# Patient Record
Sex: Male | Born: 1975 | Race: Black or African American | Hispanic: No | Marital: Married | Smoking: Never smoker
Health system: Southern US, Community
[De-identification: ages and names within clinical notes are randomized; demographics above are authoritative.]

---

## 2015-04-27 ENCOUNTER — Emergency Department (HOSPITAL_COMMUNITY)
Admission: EM | Admit: 2015-04-27 | Discharge: 2015-04-28 | Disposition: A | Discharge status: Home / Self Care | Payer: BLUE CROSS/BLUE SHIELD | Attending: Emergency Medicine | Admitting: Emergency Medicine

## 2015-04-27 ENCOUNTER — Encounter (HOSPITAL_COMMUNITY): Payer: Self-pay

## 2015-04-27 DIAGNOSIS — R109 Unspecified abdominal pain: Secondary | ICD-10-CM | POA: Insufficient documentation

## 2015-04-27 DIAGNOSIS — R197 Diarrhea, unspecified: Secondary | ICD-10-CM | POA: Diagnosis not present

## 2015-04-27 DIAGNOSIS — R1912 Hyperactive bowel sounds: Secondary | ICD-10-CM | POA: Diagnosis not present

## 2015-04-27 DIAGNOSIS — R1084 Generalized abdominal pain: Secondary | ICD-10-CM | POA: Diagnosis present

## 2015-04-27 DIAGNOSIS — R52 Pain, unspecified: Secondary | ICD-10-CM

## 2015-04-27 NOTE — ED Notes (Signed)
Pt presents with c/o abdominal pain and diarrhea. Pt reports he started taking Vitamin D recently and it "made him feel some kind of way". Pt is connecting these symptoms to the Vitamin D intake.

## 2015-04-28 ENCOUNTER — Emergency Department (HOSPITAL_COMMUNITY): Payer: BLUE CROSS/BLUE SHIELD

## 2015-04-28 LAB — COMPREHENSIVE METABOLIC PANEL
ALBUMIN: 4.3 g/dL (ref 3.5–5.0)
ALT: 32 U/L (ref 17–63)
ANION GAP: 9 (ref 5–15)
AST: 31 U/L (ref 15–41)
Alkaline Phosphatase: 90 U/L (ref 38–126)
BUN: 11 mg/dL (ref 6–20)
CALCIUM: 9.2 mg/dL (ref 8.9–10.3)
CO2: 26 mmol/L (ref 22–32)
CREATININE: 1.41 mg/dL — AB (ref 0.61–1.24)
Chloride: 103 mmol/L (ref 101–111)
GFR calc non Af Amer: >60 mL/min (ref >60)
GLUCOSE: 98 mg/dL (ref 65–99)
Potassium: 3.5 mmol/L (ref 3.5–5.1)
Sodium: 138 mmol/L (ref 135–145)
Total Bilirubin: 0.9 mg/dL (ref 0.3–1.2)
Total Protein: 9.1 g/dL — ABNORMAL HIGH (ref 6.5–8.1)

## 2015-04-28 LAB — LIPASE, BLOOD: LIPASE: 23 U/L (ref 22–51)

## 2015-04-28 LAB — CBC
HCT: 45.1 % (ref 39.0–52.0)
Hemoglobin: 15.2 g/dL (ref 13.0–17.0)
MCH: 29.8 pg (ref 26.0–34.0)
MCHC: 33.7 g/dL (ref 30.0–36.0)
MCV: 88.4 fL (ref 78.0–100.0)
Platelets: 240 10*3/uL (ref 150–400)
RBC: 5.1 MIL/uL (ref 4.22–5.81)
RDW: 14.1 % (ref 11.5–15.5)
WBC: 13.7 10*3/uL — ABNORMAL HIGH (ref 4.0–10.5)

## 2015-04-28 MED ORDER — GI COCKTAIL ~~LOC~~
30.0000 mL | Freq: Once | ORAL | Status: AC
  Administered 2015-04-28: 30 mL via ORAL
  Filled 2015-04-28: qty 30

## 2015-04-28 MED ORDER — DICYCLOMINE HCL 10 MG/ML IM SOLN
20.0000 mg | Freq: Once | INTRAMUSCULAR | Status: AC
  Administered 2015-04-28: 20 mg via INTRAMUSCULAR
  Filled 2015-04-28: qty 2

## 2015-04-28 MED ORDER — KETOROLAC TROMETHAMINE 60 MG/2ML IM SOLN
60.0000 mg | Freq: Once | INTRAMUSCULAR | Status: AC
  Administered 2015-04-28: 60 mg via INTRAMUSCULAR
  Filled 2015-04-28: qty 2

## 2015-04-28 MED ORDER — DICYCLOMINE HCL 20 MG PO TABS: 20.0000 mg | ORAL_TABLET | Freq: Two times a day (BID) | ORAL | Status: AC

## 2015-04-28 NOTE — ED Notes (Signed)
Pt is unable to give urine sample at this time but is aware that the sample is needed

## 2015-04-28 NOTE — ED Provider Notes (Signed)
CSN: [id number]     Arrival date & time 04/27/15  2254 History  This chart was scribed for Kamden Stanislaw, MD by Placido Sou, ED scribe. This patient was seen in room WA15/WA15 and the patient's care was started at 12:52 AM.    Chief Complaint  Patient presents with  . Abdominal Pain  . Diarrhea   Patient is a 39 y.o. male presenting with abdominal pain. The history is provided by the patient. No language interpreter was used.  Abdominal Pain Pain location:  Generalized Pain quality: cramping   Pain radiates to:  Does not radiate Pain severity:  Moderate Onset quality:  Sudden Duration:  7 days Timing:  Constant Progression:  Unchanged Chronicity:  New Context: not diet changes, not previous surgeries and not retching   Relieved by:  Nothing Worsened by:  Nothing tried Ineffective treatments:  None tried Associated symptoms: no chills, no fever, no nausea and no vomiting   Risk factors: no alcohol abuse     HPI Comments: Cory Harris is a 39 y.o. male who presents to the Emergency Department complaining of constant, moderate, abd pain with onset 1 week ago. Pt notes beginning an oral vitamin D supplement beginning 1 week ago due to his most recent level being 7 and further notes his last dosage taken was yesterday. He notes associated diarrhea and fever and took Alka Seltzer 3-4 hours ago with some moderate relief of his symptoms.  He denies any recent changes in his diet or being on any other medications.   History reviewed. No pertinent past medical history. History reviewed. No pertinent past surgical history. No family history on file. History  Substance Use Topics  . Smoking status: Never Smoker   . Smokeless tobacco: Not on file  . Alcohol Use: Yes     Comment: occasionally     Review of Systems  Constitutional: Negative for fever and chills.  Gastrointestinal: Positive for abdominal pain. Negative for nausea and vomiting.  All other systems reviewed and are  negative.  Allergies  Review of patient's allergies indicates no known allergies.  Home Medications   Prior to Admission medications   Not on File   BP 123/71 mmHg  Pulse 88  Temp(Src) 99.2 F (37.3 C) (Oral)  Resp 21  SpO2 98% Physical Exam  Constitutional: He is oriented to person, place, and time. He appears well-developed and well-nourished. No distress.  HENT:  Head: Normocephalic and atraumatic.  Mouth/Throat: Oropharynx is clear and moist.  Eyes: EOM are normal. Pupils are equal, round, and reactive to light.  Neck: Neck supple.  Cardiovascular: Normal rate, regular rhythm and normal heart sounds.  Exam reveals no gallop and no friction rub.   No murmur heard. Pulmonary/Chest: Effort normal and breath sounds normal. No respiratory distress. He has no wheezes. He has no rales.  Abdominal: Soft. He exhibits no mass. There is no tenderness. There is no rebound and no guarding.  Hyperactive bowel sounds;   Neurological: He is alert and oriented to person, place, and time. He has normal reflexes.  Skin: Skin is warm and dry. He is not diaphoretic.  Psychiatric: He has a normal mood and affect.  Nursing note and vitals reviewed.   ED Course  Procedures  DIAGNOSTIC STUDIES: Oxygen Saturation is 98% on RA, normal by my interpretation.    COORDINATION OF CARE: 12:58 AM Discussed treatment plan with pt at bedside and pt agreed to plan.  Labs Review Labs Reviewed  COMPREHENSIVE METABOLIC PANEL - Abnormal;  Notable for the following:    Creatinine, Ser 1.41 (*)    Total Protein 9.1 (*)    All other components within normal limits  CBC - Abnormal; Notable for the following:    WBC 13.7 (*)    All other components within normal limits  LIPASE, BLOOD  URINALYSIS, ROUTINE W REFLEX MICROSCOPIC (NOT [facility])    Imaging Review No results found.   EKG Interpretation None      MDM   Final diagnoses:  None    Medications  dicyclomine (BENTYL) injection 20 mg (20  mg Intramuscular Given 04/28/15 0138)  ketorolac (TORADOL) injection 60 mg (60 mg Intramuscular Given 04/28/15 0138)  gi cocktail (Maalox,Lidocaine,Donnatal) (30 mLs Oral Given 04/28/15 0138)   Patient well appearing, normal CT.  As the symptoms started with starting vitamin D this is likely related.  Recommend stopping same and discussing with PMD.    I personally performed the services described in this documentation, which was scribed in my presence. The recorded information has been reviewed and is accurate.     Cy BlamerApril Satoshi Kalas, MD 04/29/15 [phone]

## 2015-04-29 ENCOUNTER — Encounter (HOSPITAL_COMMUNITY): Payer: Self-pay | Admitting: Emergency Medicine

## 2017-11-29 ENCOUNTER — Emergency Department (HOSPITAL_COMMUNITY)
Admission: EM | Admit: 2017-11-29 | Discharge: 2017-11-29 | Disposition: A | Discharge status: Home / Self Care | Payer: Self-pay | Attending: Emergency Medicine | Admitting: Emergency Medicine

## 2017-11-29 ENCOUNTER — Other Ambulatory Visit: Payer: Self-pay

## 2017-11-29 ENCOUNTER — Emergency Department (HOSPITAL_COMMUNITY): Payer: Self-pay

## 2017-11-29 ENCOUNTER — Encounter (HOSPITAL_COMMUNITY): Payer: Self-pay | Admitting: *Deleted

## 2017-11-29 DIAGNOSIS — M542 Cervicalgia: Secondary | ICD-10-CM

## 2017-11-29 DIAGNOSIS — M5410 Radiculopathy, site unspecified: Secondary | ICD-10-CM | POA: Insufficient documentation

## 2017-11-29 DIAGNOSIS — Z79899 Other long term (current) drug therapy: Secondary | ICD-10-CM | POA: Insufficient documentation

## 2017-11-29 LAB — CBC
HCT: 45.4 % (ref 39.0–52.0)
HEMOGLOBIN: 15.3 g/dL (ref 13.0–17.0)
MCH: 30.1 pg (ref 26.0–34.0)
MCHC: 33.7 g/dL (ref 30.0–36.0)
MCV: 89.2 fL (ref 78.0–100.0)
Platelets: 241 10*3/uL (ref 150–400)
RBC: 5.09 MIL/uL (ref 4.22–5.81)
RDW: 13.9 % (ref 11.5–15.5)
WBC: 5.1 10*3/uL (ref 4.0–10.5)

## 2017-11-29 LAB — BASIC METABOLIC PANEL
Anion gap: 10 (ref 5–15)
BUN: 11 mg/dL (ref 6–20)
CHLORIDE: 104 mmol/L (ref 101–111)
CO2: 24 mmol/L (ref 22–32)
Calcium: 9 mg/dL (ref 8.9–10.3)
Creatinine, Ser: 1.23 mg/dL (ref 0.61–1.24)
GFR calc Af Amer: >60 mL/min (ref >60)
GLUCOSE: 107 mg/dL — AB (ref 65–99)
POTASSIUM: 4.6 mmol/L (ref 3.5–5.1)
Sodium: 138 mmol/L (ref 135–145)

## 2017-11-29 LAB — I-STAT TROPONIN, ED: Troponin i, poc: 0 ng/mL (ref 0.00–0.08)

## 2017-11-29 MED ORDER — METHOCARBAMOL 500 MG PO TABS: 500.0000 mg | ORAL_TABLET | Freq: Two times a day (BID) | ORAL | 0 refills | Status: AC

## 2017-11-29 MED ORDER — PREDNISONE 10 MG PO TABS: ORAL_TABLET | ORAL | 0 refills | Status: AC

## 2017-11-29 NOTE — ED Provider Notes (Signed)
[facility] Provider Note   CSN: [id number] Arrival date & time: 11/29/17  [id number]     History   Chief Complaint No chief complaint on file.   HPI Cory Harris is a 42 y.o. male.  The history is provided by the patient. No language interpreter was used.  Neck Injury  This is a new problem. The current episode started yesterday. The problem occurs constantly. The problem has been gradually improving. Nothing aggravates the symptoms. Nothing relieves the symptoms. He has tried nothing for the symptoms. The treatment provided mild relief.  Pt reports he had a pain in the back of his neck last pm.  Pt reports he has had some pain in his arm and numbness in hand and arm.  Pt reports symptoms come and go.  [place] here from [place].    History reviewed. No pertinent past medical history.  There are no active problems to display for this patient.   History reviewed. No pertinent surgical history.     Home Medications    Prior to Admission medications   Medication Sig Start Date End Date Taking? Authorizing Provider  acetaminophen (TYLENOL) 500 MG tablet Take 500 mg by mouth every 6 (six) hours as needed for mild pain.   Yes [provider]  dicyclomine (BENTYL) 20 MG tablet Take 1 tablet (20 mg total) by mouth 2 (two) times daily. Patient taking differently: Take 20 mg by mouth as needed.  04/28/15  Yes Palumbo, April, MD  loratadine-pseudoephedrine (CLARITIN-D 24-HOUR) 10-240 MG 24 hr tablet Take 1 tablet by mouth daily as needed for allergies.   Yes [provider]  Multiple Vitamin (MULTIVITAMIN WITH MINERALS) TABS tablet Take 1 tablet by mouth daily.   Yes [provider]  naproxen sodium (ALEVE) 220 MG tablet Take 220 mg by mouth daily as needed (for pain).   Yes [provider]  ibuprofen (ADVIL,MOTRIN) 200 MG tablet Take 400 mg by mouth every 6 (six) hours as needed for moderate pain.    [provider]  saccharomyces boulardii (FLORASTOR) 250 MG capsule Take 250 mg by mouth 2 (two) times daily.    [provider]  Vitamin D, Ergocalciferol, (DRISDOL) 50000 UNITS CAPS capsule Take 1 capsule by mouth once a week. Monday 04/19/15   [provider]    Family History No family history on file.  Social History Social History   Tobacco Use  . Smoking status: Never Smoker  . Smokeless tobacco: Never Used  Substance Use Topics  . Alcohol use: Yes    Comment: occasionally   . Drug use: No     Allergies   Patient has no known allergies.   Review of Systems Review of Systems  Musculoskeletal: Positive for neck pain. Negative for back pain and joint swelling.  All other systems reviewed and are negative.    Physical Exam Updated Vital Signs BP (!) 139/93   Pulse (!) 58   Temp 98.2 F (36.8 C) (Oral)   Resp 15   Ht 5\' 11"  (1.803 m)   Wt 113.4 kg (250 lb)   SpO2 98%   BMI 34.87 kg/m   Physical Exam  Constitutional: He appears well-developed and well-nourished.  HENT:  Head: Normocephalic.  Right Ear: External ear normal.  Left Ear: External ear normal.  Mouth/Throat: Oropharynx is clear and moist.  Eyes: Pupils are equal, round, and reactive to light.  Neck: Normal range of motion.  Tender right trapezius, from  bilat arms,  Good strength.  Moves all fingers.   Cardiovascular: Normal rate.  Pulmonary/Chest: Effort normal.  Musculoskeletal: Normal range of motion.  Neurological: He is alert.  Skin: Skin is warm.  Psychiatric: He has a normal mood and affect.  Nursing note and vitals reviewed.    ED Treatments / Results  Labs (all labs ordered are listed, but only abnormal results are displayed) Labs Reviewed  BASIC METABOLIC PANEL - Abnormal; Notable for the following components:      Result Value   Glucose, Bld 107 (*)    All other components within normal limits  CBC  I-STAT TROPONIN, ED    EKG  EKG  Interpretation None       Radiology Dg Chest 2 View  Result Date: 11/29/2017 CLINICAL DATA:  Right shoulder and arm pain. EXAM: CHEST  2 VIEW COMPARISON:  None. FINDINGS: Heart and mediastinal contours are within normal limits. No focal opacities or effusions. No acute bony abnormality. IMPRESSION: No active cardiopulmonary disease. Electronically Signed   By: Charlett Nose M.D.   On: 11/29/2017 08:57   Dg Cervical Spine Complete  Result Date: 11/29/2017 CLINICAL DATA:  Right-sided neck pain radiating into the right arm and hand. EXAM: CERVICAL SPINE - COMPLETE 4+ VIEW COMPARISON:  None. FINDINGS: The lateral view is diagnostic to the C7-T1 level. There is no acute fracture or subluxation. Vertebral body heights are preserved. Alignment is normal. Interveterbral disc spaces are maintained. The neural foramina are widely patent.Normal prevertebral soft tissues. IMPRESSION: Negative cervical spine radiographs. Electronically Signed   By: Obie Dredge M.D.   On: 11/29/2017 12:25    Procedures Procedures (including critical care time)  Medications Ordered in ED Medications - No data to display   Initial Impression / Assessment and Plan / ED Course  I have reviewed the triage vital signs and the nursing notes.  Pertinent labs & imaging results that were available during my care of the patient were reviewed by me and considered in my medical decision making (see chart for details).     MDM  Cspine xray is normal, Chest xray is normal.  I suspect pain is radicular.  I will treat with prednisone and robaxin.  Pt is advised to follow up with Orthopaedist in [place]  Final Clinical Impressions(s) / ED Diagnoses   Final diagnoses:  Neck pain  Radiculopathy, unspecified spinal region    ED Discharge Orders        Ordered    predniSONE (DELTASONE) 10 MG tablet     11/29/17 1254    methocarbamol (ROBAXIN) 500 MG tablet  2 times daily     11/29/17 1254    An After Visit Summary was  printed and given to the patient.    Elson Areas, [place] 11/29/17 1300    Margarita Grizzle, MD 11/30/17 [phone]

## 2017-11-29 NOTE — ED Triage Notes (Addendum)
Last pm at 9:45pm started having right posterior neck pain and then noticed right pain to right arm with tingling and numbness and patient states the symptoms come and go.  Pt states the tingling then also went to left hand.  NO loss in function.  Pt did report a sharp pain to right leg.  Reports some chest pain before

## 2017-11-29 NOTE — Discharge Instructions (Signed)
Schedule to see an Orthopaedist for recheck.  Ice area of pain

## 2017-12-27 ENCOUNTER — Ambulatory Visit: Admitting: Surgery

## 2017-12-27 ENCOUNTER — Ambulatory Visit: Admit: 2017-12-27 | Payer: No Typology Code available for payment source

## 2017-12-27 NOTE — Progress Notes (Signed)
.  Progress Notes  .  Patient: Gilbert Stewart  Provider: Augustine Radar    .  DOB: [birth year] Age: 42 Y Sex: Male  .  PCP: Jonathon Bellows    Date: 12/27/2017  .  --------------------------------------------------------------------------------  .  REASON FOR APPOINTMENT  .  1. NP for hematospermia  .  2. Hematospermia  .  HISTORY OF PRESENT ILLNESS  .  Adult Urology:   This is a 42 year old male who is here upon self referral for my  opinion regarding hematospermia..  .  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 It happened once. .Color of semen:  rusty color.Gross hematospermia.Prior history of GU surgery or  trauma: History of Chronic prostatitis for 10-15 years. Sees a  Insurance underwriter in [place] [facility]: Dr Normand Sloop, Montez Hageman. M.D) treated with Ciprofloxacin/Doxacycline with  good effect.Pain during intercourse or ejaculation:  None.Fever/chills, dysuria, urgency/frequency or weak urine  stream:.History of fertility: Never had kids .Family history of  prostate cancer or other GU cancers: None.Patient denies gross  hematuria, fever/chills, abdominal/flank pain, urgency/frequency,  weak urine stream, dysuria, appetite changes, new bony pain,  unintentional weight loss or fatigue...  .  ED:   Pt also mentioned that his erection is not as strong as before.  It can not maintain. He also feels decreased libido.  [name]  CURRENT MEDICATIONS  .  None  .  PAST MEDICAL HISTORY  .  Hematospermia  .  ALLERGIES  .  N.K.D.A.  .  SURGICAL HISTORY  .  No Surgical History documented.  [name]  FAMILY HISTORY  .  Mother: alive, diagnosed with Hypertension  Father: deceased, car accident  2 brother(s) , 2 sister(s) .  sister has systemic lupus.  .  SOCIAL HISTORY  .  .  Tobacco  history:Never smoked  .  [name]  Alcohol  Occasionally  .  HOSPITALIZATION/MAJOR DIAGNOSTIC PROCEDURE  .  No Hospitalization History.  [name]  REVIEW OF SYSTEMS  .  Urology ROS:  .  Constitutional:     No fevers, chills, weight loss or general  weakness . Head:    No headache or dizziness . Eyes:    No loss  of vision or double vision . Ears:    No hearing loss, tinnitus,  otalgia, otorrhea . Nose:    No nasal discharge . Throat:    No  hoarseness or difficulty swallowing . Integumentary:    No rash  or skin lesions. No changes in hair or nail character . Lungs:     No shortness of breath. No new, frequent cough . Cardiovascular:     No chest pain or palpitations . Gastrointestinal:    No  abdominal pain, nausea, vomiting or change in bowel movements .  Genitourinary:    No dysuria or urethral discharge .  Musculoskeletal:    No joint/muscle pain or swelling. Normal gait  and mobility . Neurological:    No seizures, light headedness,  memory loss or numbness . Psychiatry:    No mood or behavioral  changes. No anxiety or depression . Endocrine:    No hirsutism,  excessive hair loss/alopecia. No polyuria or polydipsia .  Hematologic/Lymphatic:    No easy bruising, bleeding or enlarged  lymph nodes . Allergic/Immunologic:    No environmental or food  allergies .  [name]  VITAL SIGNS  .  Pain scale 3, Ht-in 71, Wt-lbs 244, BMI 34.03, BSA  2.35, Ht-cm  180.34, Wt-kg 110.68.  [name]  PHYSICAL EXAMINATION  .  Urology PE:  General Appearance:  Well-developed, well nourished, alert and  cooperative, NAD, normal secondary sexual characteristics.  HEENT:  Normo-cephalic, atraumatic, PERRLA, EOMI, anicteric,  vision grossly intact, no nasal discharge. Sinuses, non-tender.  Oropharhynx no exudate or lesions.  Skin:  Normal color, texture, turgor, with no lesions, eruptions,  rashes, bruises or petechiae.  Neck:  Supple, no masses. Normal ROM. Trachea is in midline. No  thyromegaly.  Chest  Normal AP diameter. No rib tenderness.  Lungs:  Normal respirations, symmetric excursion with no  accessory muscle use.  Back:  no CAVT Vertebral column aligned no scoliosis or kyphosis.  No masses or tenderness.  Cardiovascular:  RRR. No murmur,  pulses are intact.  Abdomen:  Soft, benign, non-tender, non-distended, no palpable  masses, no hepato-splenomegaly, no hernias. Bladder is not  palpable.  Extremities:  No clubbing, cyanosis or pitting edema.  Musculo-Skeletal:  No muscle wasting, joint swelling or  tenderness.  Lymphatic:  No cervical, axillary or inguinal adenopathy.  Neurologic:  Oriented 3x with no focal deficits.  GU Male:  Genitalia:  Normally developed male genitalia.  Phallus:  No lesions or chordee.  Glans:  No lesions or inflammation.  Meatus:   No discharge.  Scrotum:  No mass or tenderness. No hernias, hydroceles or  varicoceles, no Lymphadenopathy.  Spermatic Cords:  No masses, induration or tenderness.  Epididymes:  No masses, tenderness or induration.  Testes:  Normal size and consistency. No masses, asymmetry,  tenderness or atrophy.  Back:  No sacral Rebecca or dimples. No CVAT or SPT. Bladder is not  palpable.  .  ASSESSMENTS  .  Hematospermia - R36.1 (Primary)  .  Erectile dysfunction, unspecified erectile dysfunction type -  N52.9  .  TREATMENT  .  Hematospermia  LAB: Urine Dip POC  Negative for LE, NIT, and Blood  Notes: Hematospermia is almost always a benign condition. There  are multiple causes for hematospermia, such as urinary tract  infection, seminal duct abnormalities, enlarged prostate,  prostatic calculi, GU tumors (very rare), coagulopathy. In most  cases, the evaluation will not identify a clear cause of  hematospermia. Thus, there is no specific medical or surgical  treatment for the majority of patients, and the condition will  usually resolve spontaneously. . There are no obvious reasons at  this point for his hematospermia after evaluation. I reassured  him and recommended observational management for now. If his  hematospermia is persistent or he notices any symptoms, he will  call my office. The further evaluation would be transrectal U/S,  pelvic MRI, cystoscopy or even seminal vesiculoscopy. [name] He  understood  and will call my office for follow up as needed. Will  check Testosterone.  .  .  Erectile dysfunction, unspecified erectile  dysfunction type  Notes: We discussed the etiology of erectile function, including  psychogenic, vasculogenic, endocrinogenic, neurogenic. The most  common reason for ED is age then follow by diabetes. .Pt would  like to have PSA and T checked to rule out hormonal etiology of  his ED first. Will check the lab and RTC in one month.  [name]  PREVENTIVE MEDICINE  .  Counseling:  .  Smoking   .  BMI Management   .  [name]  PROCEDURE CODES  .  7532 URO MD URINALYSIS AUTO W/O MICROSCOPY  .  FOLLOW UP  .  Testosterone check and PSA total in AM; F/U  in one month  .  Electronically signed by Augustine Radar , MD on  12/29/2017 at 11:42 PM EDT  .  Document electronically signed by Augustine Radar    .

## 2017-12-27 NOTE — Progress Notes (Signed)
* * *        Gilbert Stewart**    --- ---    84 Y old Male, DOB: [birth year], External MRN: [id number]    Account Number: [id number]    [id number]    Home: [phone]    Insurance: COMM[employer]CIAL INSURANCE    PCP: Gilbert Stewart Referring: Gilbert Stewart    Appointment Facility: [facility]        * * *    12/27/2017  Progress Notes: Gilbert Stewart **CHN#:** [id number]    --- ---    ---         **Current Medications**    ---       None    ---       **Past Medical History**    ---       Hematospermia.        ---       **Surgical History**    ---       No Surgical History documented.    ---       **Family History**    ---       Mother: alive, diagnosed with Hypertension    ---    Father: deceased, car accident    ---    2 brother(s) , 2 sister(s) .    ---    sister has systemic lupus.    ---       **Social History**    ---    Tobacco    history: _Never smoked_    Alcohol    _Occasionally_       **Allergies**    ---       N.K.D.A.    ---       **Hospitalization/Major Diagnostic Procedure**    ---       No Hospitalization History.    ---       **Review of Systems**    ---     _Urology ROS_ :    Constitutional: No fevers, chills, weight loss or general weakness. Head: No  headache or dizziness. Eyes: No loss of vision or double vision. Ears: No  hearing loss, tinnitus, otalgia, otorrhea. Nose: No nasal discharge. Throat:  No hoarseness or difficulty swallowing. Integumentary: No rash or skin  lesions. No changes in hair or nail character. Lungs: No shortness of breath.  No new, frequent cough. Cardiovascular: No chest pain or palpitations.  Gastrointestinal: No abdominal pain, nausea, vomiting or change in bowel  movements. Genitourinary: No dysuria or urethral discharge. Musculoskeletal:  No joint/muscle pain or swelling. Normal gait and mobility. Neurological: No  seizures, light headedness, memory loss or numbness. Psychiatry: No mood or  behavioral changes. No anxiety or depression.  Endocrine: No hirsutism,  excessive hair loss/alopecia. No polyuria or polydipsia.  Hematologic/Lymphatic: No easy bruising, bleeding or enlarged lymph nodes.  Allergic/Immunologic: No environmental or food allergies.            **Reason for Appointment**    ---       1\. NP for hematospermia    ---    2\. Hematospermia    ---       **Assessments**    ---    1\. Hematospermia - R36.1 (Primary)    ---    2\. Erectile dysfunction, unspecified erectile dysfunction type - N52.9    ---       **Treatment**    ---       **1\. Hematospermia**    _LAB: Urine Dip  POC_ Negative for LE, NIT, and Blood    Notes: Hematospermia is almost always a benign condition. There are multiple  causes for hematospermia, such as urinary tract infection, seminal duct  abnormalities, enlarged prostate, prostatic calculi, GU tumors (very rare),  coagulopathy. In most cases, the evaluation will not identify a clear cause of  hematospermia. Thus, there is no specific medical or surgical treatment for  the majority of patients, and the condition will usually resolve  spontaneously. . There are no obvious reasons at this point for his  hematospermia after evaluation. I reassured him and recommended observational  management for now. If his hematospermia is persistent or he notices any  symptoms, he will call my office. The further evaluation would be transrectal  U/S, pelvic MRI, cystoscopy or even seminal vesiculoscopy. [name] He understood and  will call my office for follow up as needed.    Will check Testosterone.    ---        **2\. Erectile dysfunction, unspecified erectile dysfunction type**    Notes: We discussed the etiology of erectile function, including psychogenic,  vasculogenic, endocrinogenic, neurogenic. The most common reason for ED is age  then follow by diabetes. .    Pt would like to have PSA and T checked to rule out hormonal etiology of his  ED first.    Will check the lab and RTC in one month.      **Preventive Medicine**    ---        Counseling:    Smoking .    BMI Management .    ---      **Follow Up**    ---    Testosterone check and PSA total in AM; F/U in one month       **History of Present Illness**    ---     _Adult Urology_ :    This is a 42 year old male who is here upon self referral for my opinion  regarding hematospermia.    .     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    [name]    He noticed blood in his semen since March 2019. It happened once.    .    Color of semen: rusty color    .    Gross hematospermia    .    Prior history of GU surgery or trauma: History of Chronic prostatitis for  10-15 years. Sees a Insurance underwriter in [place] Gilbert Stewart clinic: Dr Gilbert Stewart, Gilbert Stewart. M.D) treated with Ciprofloxacin/Doxacycline with good effect    .    Pain during intercourse or ejaculation: None    .    Fever/chills, dysuria, urgency/frequency or weak urine stream:    .    History of fertility: Never had kids    .    Family history of prostate cancer or other GU cancers: None    .    Patient denies gross hematuria, fever/chills, abdominal/flank pain,  urgency/frequency, weak urine stream, dysuria, appetite changes, new bony  pain, unintentional weight loss or fatigue.    .    .     _ED_ :    Pt also mentioned that his erection is not as strong as before. It can not  maintain. He also feels decreased libido.       **Vital Signs**    ---    Pain scale 3, Ht-in 71, Wt-lbs 244, BMI 34.03, BSA 2.35, Ht-cm 180.34, Wt-kg  110.68.       **Physical Examination**    ---     _Urology PE_ :    General Appearance: Well-developed, well nourished, alert and cooperative,  NAD, normal secondary sexual characteristics.    HEENT: Normo-cephalic, atraumatic, PERRLA, EOMI, anicteric, vision grossly  intact, no nasal discharge. Sinuses, non-tender. Oropharhynx no exudate or  lesions.    Skin: Normal color, texture, turgor, with no lesions, eruptions, rashes,  bruises or petechiae.    Neck: Supple, no masses. Normal ROM. Trachea is  in midline. No thyromegaly.    Chest Normal AP diameter. No rib tenderness.    Lungs: Normal respirations, symmetric excursion with no accessory muscle use.    Back: no CAVT Vertebral column aligned no scoliosis or kyphosis. No masses or  tenderness.    Cardiovascular: RRR. No murmur, pulses are intact.    Abdomen: Soft, benign, non-tender, non-distended, no palpable masses, no  hepato-splenomegaly, no hernias. Bladder is not palpable.    Extremities: No clubbing, cyanosis or pitting edema.    Musculo-Skeletal: No muscle wasting, joint swelling or tenderness.    Lymphatic: No cervical, axillary or inguinal adenopathy.    Neurologic: Oriented 3x with no focal deficits.    _GU Male_ :    Genitalia: Normally developed male genitalia.    Phallus: No lesions or chordee.    Glans: No lesions or inflammation.    Meatus: No discharge.    Scrotum: No mass or tenderness. No hernias, hydroceles or varicoceles, no  Lymphadenopathy.    Spermatic Cords: No masses, induration or tenderness.    Epididymes: No masses, tenderness or induration.    Testes: Normal size and consistency. No masses, asymmetry, tenderness or  atrophy.    Back: No sacral [place] or dimples. No CVAT or SPT. Bladder is not palpable.          **Procedure Codes**    ---       4782 URO MD URINALYSIS AUTO W/O MICROSCOPY    ---    Electronically signed by Gilbert Stewart , MD on 12/29/2017 at 11:42 PM EDT    Sign off status: Completed        * * *        [facility]    [address]    [place], [place] [address]    Tel: [phone]    Fax: [phone]              * * *          Patient: Gilbert Stewart, Gilbert Stewart DOB: 1976/03/24 Progress Note: Gilbert Stewart  12/27/2017    ---    Note generated by eClinicalWorks EMR/PM Software ([email or web address])

## 2018-01-03 ENCOUNTER — Ambulatory Visit: Admitting: Surgery

## 2018-01-28 ENCOUNTER — Ambulatory Visit

## 2018-01-28 ENCOUNTER — Ambulatory Visit: Admitting: Internal Medicine

## 2018-01-28 ENCOUNTER — Ambulatory Visit: Admit: 2018-01-28 | Payer: PPO

## 2018-01-28 LAB — HX HEM-ROUTINE
HX HCT: 46.8 % (ref 37.0–47.0)
HX HGB: 15.7 g/dL (ref 13.5–16.0)
HX MCH: 29.6 pg (ref 26.0–34.0)
HX MCHC: 33.5 g/dL (ref 32.0–36.0)
HX MCV: 88.1 fL (ref 80.0–98.0)
HX MPV: 10.7 fL (ref 9.1–11.7)
HX NRBC #: 0 10*3/uL
HX NUCLEATED RBC: 0 %
HX PLT: 268 10*3/uL (ref 150–400)
HX RBC BLOOD COUNT: 5.31 M/uL (ref 4.20–5.50)
HX RDW: 14.2 % (ref 11.5–14.5)
HX WBC: 6.2 10*3/uL (ref 4.0–11.0)

## 2018-01-28 LAB — HX BF-URINALYSIS
HX KETONES: NEGATIVE mg/dL
HX LEUKOCYTE ES: NEGATIVE
HX NITRITE LEVEL: NEGATIVE
HX RED BLOOD CELLS: 1 /HPF
HX SPECIFIC GRAVITY: 1.024
HX U BILIRUBIN: NEGATIVE
HX U GLUCOSE: NEGATIVE mg/dL
HX U PH: 5
HX U PROTEIN: NEGATIVE mg/dL
HX U UROBILINIG: 0.2 EU
HX WHITE BLOOD CELLS: 1 /HPF

## 2018-01-28 LAB — HX CHEM-LIPIDS
HX CHOL-HDL RATIO: 6.1
HX CHOLESTEROL: 232 mg/dL — ABNORMAL HIGH (ref 110–199)
HX HIGH DENSITY LIPOPROTEIN CHOL (HDL): 38 mg/dL (ref 35–75)
HX HOURS FAST: 0 h
HX LDL: 135 mg/dL — ABNORMAL HIGH (ref 0–129)
HX TRIGLYCERIDES: 293 mg/dL — ABNORMAL HIGH (ref 40–250)

## 2018-01-28 LAB — HX CHOLESTEROL
HX CHOLESTEROL: 232 mg/dL — ABNORMAL HIGH (ref 110–199)
HX HIGH DENSITY LIPOPROTEIN CHOL (HDL): 38 mg/dL (ref 35–75)
HX LDL: 135 mg/dL — ABNORMAL HIGH (ref 0–129)
HX TRIGLYCERIDES: 293 mg/dL — ABNORMAL HIGH (ref 40–250)

## 2018-01-28 NOTE — Progress Notes (Signed)
Checkout  Return to Clinic 4 weeks  Next Visit Notes: f/u  Appointment Made Yes  RTC Date: 02/15/2018          Created By Mertha Finders on 01/28/2018 at 02:54 PM    Electronically Signed By Mertha Finders on 01/28/2018 at 02:54 PM

## 2018-01-29 ENCOUNTER — Ambulatory Visit

## 2018-01-29 LAB — HX CHLAMYDIA: HX CHLAMYDIA DNA PROBE: NEGATIVE

## 2018-01-29 LAB — HX MOLECULAR BIO
HX CHLAMYDIA DNA PROBE: NEGATIVE
HX CHLAMYDIA DNA PROBE: NEGATIVE
HX GC DNA PROBE: NEGATIVE
HX GC DNA PROBE: NEGATIVE

## 2018-01-29 LAB — HX DIABETES: HX HEMOGLOBIN A1C: 5.9 % — ABNORMAL HIGH

## 2018-01-29 LAB — HX CHEM-METABOLIC: HX HEMOGLOBIN A1C: 5.9 % — ABNORMAL HIGH

## 2018-01-29 NOTE — Progress Notes (Signed)
[facility]  [address]  [place] [address]  Main: [phone]  Fax: [phone]  Patient Portal: [email or web address]      January 30, 2018            MRN: [id number]            DOB: 1975/12/12   Gilbert Stewart   [address]   [place] [id number]      The results of your recent tests are as follows:      Your cholesterol is high and you are at risk for diabetes. It is important that you focus on improving your diet and exercising more, with at least 30 minutes of exercise 5 days a week.     Your urine test showed blood, but only 1 red blood cell. Your CPK level was a little high, but could be normal for you (normal levels are higher in African American men). This mild elevation can sometimes cause a urine test to be falsely positive for blood. Avoid heavy lifting and stay well hydrated. We will repeat both of these tests at your next visit.      Cholesterol Tests:  Your cholesterol is high.     Total Cholesterol   232  Normal 110-199    01/28/2018    HDL (good cholesterol)  38  Normal >40     01/28/2018    Triglyceride    293  Normal 40-250    01/28/2018    LDL (bad cholesterol)   135  Normal <160    01/28/2018     Diabetes Tests:  This showed that you are at risk for diabetes.     HgbA1c:    5.9  Normal 4.3 - 5.6    01/28/2018     Blood Count Tests:      Hematocrit:    46.8  Normal 32-45 women, 37-47 men 01/28/2018    Hemoglobin:    15.7  Normal 13.5-16 male, 89-15 male 01/28/2018    White Blood Cells:   6.2  Normal 4 - 11    01/28/2018    Platelets:    268  Normal 150-400    01/28/2018    MCV:     88.1  Normal 80-96    01/28/2018     ID Tests:  You do not have gonorrhea or chlamydia.     Gonorrhea (GC):   Negative  Normal=Negative  01/28/2018    Chlamydia (Chl):   Negative  Normal=Negative  01/28/2018     Other Tests:  CPK: 473  Urinalysis     Color, Ur                 Yellow                                           Appearance,Ur             Clear                                             Sp  Gravity               1.024  1.001-1.035           pH, Ur                   5                           5.0-8.0              Leuk  Est, Ur             Negative                    Negative             Nitrites, Ur              Negative                    Negative             Protein,Ur                Negative mg/dL              Negative             Glucose, Ur               Negative mg/dL              Negative             Ketones, Ur               Negative mg/dL              Negative             Urobil, Ur                0.2 EU                      0.2-1.0              Bilirubin, Ur             Negative                    Negative             Blood, Ur            [A]  1+                          Negative    Urine Microscopic     WBC,Ur                    1 /hpf                      0-5                  RBC,Ur                    1 /hpf                      0-5                  Epith Cells,Ur            Few /hpf                                       !  Mucous, Ur           [A]  Present                                               Sincerely,       Buel Ream MD -[facility]-,   [facility]            Created By Mertha Finders on 01/29/2018 at 02:08 PM    Electronically Signed By Buel Ream MD -[facility]- on 01/30/2018 at 01:37 PM

## 2018-01-30 ENCOUNTER — Ambulatory Visit

## 2018-01-30 LAB — HX CHEM-ENZ-FRAC: HX CREATINE KINASE (CPK/CK): 473 IU/L — ABNORMAL HIGH (ref 20–210)

## 2018-01-30 NOTE — Telephone Encounter (Signed)
Call Details:   I spoke with Gilbert Stewart about his lab results. I advised him to increase his exercise and to improve his diet given his high cholesterol and elevated HbA1c. His mild elevation of CPK could be the cause of the positive blood on UA, but could be normal for his age and race. ......................................Marland KitchenBuel Ream MD -[facility]-  January 30, 2018 1:38 PM        RESPONSE/ORDERS:                 ORDERS/PROBS/MEDS/ALL     Problems:   HEMATOSPERMIA (ICD-608.82) (ICD10-R36.1)  BACK PAIN < 3 MONTHS (ICD-724.5) (ICD10-M54.9)  HEMATURIA, UNSPECIFIED (ICD-599.70) (ICD10-R31.9)  LIPID DISORDER SCREENING (ICD-V77.91) (ICD10-Z13.220)  SCREENING FOR DIABETES MELLITUS (ICD-V77.1) (ICD10-Z13.1)  BLACK STOOLS (ICD-578.1) (SWN46-E70.1)            Created By Buel Ream MD -[facility]- on 01/30/2018 at 01:37 PM    Electronically Signed By Buel Ream MD -[facility]- on 01/30/2018 at 01:38 PM

## 2018-01-31 ENCOUNTER — Ambulatory Visit: Admitting: Internal Medicine

## 2018-01-31 ENCOUNTER — Ambulatory Visit

## 2018-01-31 ENCOUNTER — Ambulatory Visit: Admit: 2018-01-31 | Payer: No Typology Code available for payment source

## 2018-01-31 NOTE — Progress Notes (Signed)
General Medicine Visit - Resident note with preceptor addendum  Service Due by Standard Protocol Rules: PHQ2 SCORE, PATPORTALPIN.    [name]  Initial Screening   * [place]: [place] ([place])  Ht: 70.5 in.  Wt: 149.4 lbs.   BMI: 21.21  Temp: 98.6 deg F.     BP (Initial [place] Screening): 132 / 80      BP (Rechecked, Actionable): 132 /   80 mmHg   HR: 72     Travel outside of the [place] in past 28 days:: Yes  .  Med List: PRINTED by [facility] for patient   Chronic Pain Assessment: Does pt experience chronic pain ? YES  Severity of pain? (min=0, max=10) 3  Smoking Assessment: Tobacco use? never smoker  .  .  .  Falls Risk Assessment:   In the past year, have you ... Had no falls  Difficulty with balance? NO  Need assistance with ambulation while here? NO  .  Comments: .......................................Marland KitchenAngelina Sheriff  January 28, 2018 1:22 PM  .  .  **Resident Note**  .  * Preceptor: Sathi ([place])  CC: back pain  .  HPI: Back pain--Left flank pain. began 2-3 months ago. It is worse with cer  tain movements. It usually does not radiate. Occassionally it radiates to l  eft groin. No associated urinary symptoms. Takes tylenol, motrin, aleve occ  assionally for pain relief.   [name]  ?Black stools--flecks of black in stool. Most of stool is brown. No diarrhe  a. Does have recent NSAID use, no other risk factors for UGIB. No dizziness  /light headedness. Has been going on for 2-3 weeks.   .  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 He did n  ot notice any blood in his urine. Had normal urine dip at urology 1 month a  go. He did have hematospermia at that prompted that visit.   .  .  Past Medical History:(reviewed)  chronic prostatis  .  .  .  Social History: (reviewed)   Rare EtOH use  Monogamous with wife, previous negative testing for STIs, including negativ  e HIV  .  [name]  Review of Systems: ROS  Gen: No F/C   Eye: No diplopia, vision  loss  Ears/Nose/Throat: No tinnitus or hearing loss  Cardiovascular: No CP, syncope  Respiratory: No cough, dyspnea  Gastrointestinal: No N/V/D/C, abd pain  Genitourinary: No dysuria, hematuria, urinary freq  Musculoskeletal: + back pain, no jt pain or swelling  Skin: No rash, susp lesions  Neurologic: No weakness, HA  Endocrine: No polydipsia/uria, wt change  Heme/Lymphatic: No abnormal bruising, bleeding, enlarged lymph nodes  All other systems unremarkable  .  .  .  PROBLEMS:   PROBLEM CHANGES:  Added new problem of BLACK STOOLS (ICD-578.1) (ZOX09-U04.5) - Signed  Added new problem of SCREENING FOR DIABETES MELLITUS (ICD-V77.1) (WUJ81-X91  .1)  Added new problem of LIPID DISORDER SCREENING (ICD-V77.91) (ICD10-Z13.220)  Added new problem of HEMATURIA, UNSPECIFIED (ICD-599.70) (YNW29-F62.9)  Added new problem of BACK PAIN < 3 MONTHS (ICD-724.5) (ICD10-M54.9)  Added new problem of HEMATOSPERMIA (ZHY-865.78) (ICD10-R36.1)  Problems Reviewed:  Done  .  [name]  MEDICATIONS:   No Known Medications  Medications Reviewed:  Done  .  [name]  ALLERGIES:   No Known Allergies  Allergies Reviewed:  Done  .  [name]  DIRECTIVES:   .  [name]  [name]  Vitals:   BP: 132 / 80 mmHg Ht: 70.5 in.  Wt: 149.4 lbs.  BMI (in-lb) 21.21  Temp: 98.6deg F.   Pulse Rate: 72 bpm  .  .  Additional PE: HEENT: PERRLA, EOMI, tympanic membranes normal bilaterally.   Normal nasal turbinates; MMM. No LAD. No thyromegaly. No conjunctival pallo  r appreciated  Lungs: Clear to auscultation bilaterally  Heart: Normal S1/S2. No murmurs, rubs or gallops appreciated  Abdomen: Soft, non tender. No organomegaly appreciated  Extremities: No edema, cyanosis. Pulses 2+.   Rectal: patient deferred  .  [name]  Assessment /T/ Plan:   Back pain--seems to be musculoskeletal in nature. Advised heat/ice, lidocai  ne patches, tylenol. No NSAIDs for now given ?black stools. Given his ?hema  turia, could be kidney stone but seems less likely given persistence of mil  d pain for 2 months, and worse with  movement.  [name]  ?Black stools--unclear if true melena based on history. Will check CBC, sto  ol gauaic. Avoid NSAIDS. if postive for blood, will start PPI and refer to   GI  .  Hematuria--will check UA, gonorrhea, chlamydia today  .  HCM--check lipids, HbA1c.   .  RTC in 2-4 weeks with PCP to establish care  .  .  .  * Resident Signature (.sign): ......................................Marland KitchenAshok Pall MD -[facility]-  January 28, 2018 2:51 PM  .  .  .  ORDERS:  iFOBT kit for Other Screen (GI bleed) [CPT-82273]  CBC  [CPT-85027]  Hemoglobin A1C (Glycohemoglobin) [CPT-83036]  Lipid Profile-Chol, HDL, Trig, LDL(calc) [CPT-80061]  Urinalysis with microscopic  [CPT-80243]  Gonorrhea DNA Probe [CPT-87591]  Chlamydia probe [CPT-86631]  RTC in 4 weeks [RTC-028]  New Level 4 [CPT-99204]  GC - Res+Preceptor [Mod-GC]  .  [name]  **Preceptor Note**  Resident: Ernestina Penna Alycia Rossetti)  Problem New Patient  .  [name]  Subjective   Patient is a 42 Years Old Male who presents to clinic for new pt visit.    [name]  Notes diagnosis of chronic prostatitis in the past with urology.  No urinar  y symptoms.    Malvin Johns urology here for hematospermia who provided reassurance.  .  Notes intermittent left flank pain.  Occasionally radiates to groin.  Had u  rine dip with urology, which was normal, but states he had a urine dip a we  ek ago which showed blood.  .  Notes black, tarry stools for 2 weeks.  Was taking aleve/ibuprofen, has not   taken in a week.  Had EGD in the past, which was normal.   I discussed the patient with Dr. Ernestina Penna Alycia Rossetti) in a routine precepting enc  ounter.  I also personally interviewed and examined the patient.  I agree w  ith the patient's diagnosis and management.   .  .  Objective   General:  Alert, NAD  Head:  Normocephalic, atraumatic  Eyes:  No conjunctival pallor  ENT:  Normal hearing; moist mucous membranes  Pulm:  Normal respiratory effort; clear to auscultation bilaterally, no whe  ezes, rales, or rhonchi  CVS:  nl S1 S2, no murmurs, rubs, or  gallops  .  .  see resident's note for additional details, see resident's note for details  .    [name]  Assessment   back pain  hematospermia  ?black stools  .  Plan   - conservative measures for back pain  - check U/A  - check CBC, stool guiaic, if concerning start  PPI and refer to GI  - avoid NSAIDs  - check A1c, lipids  See resident note for details, Follow-up per resident note, Return visit sc  heduled  .  Seen On Team (Floor):  4B  .  [name]  .  Patient Care Plan  .  .  .  Immunization Worksheet 2016   .  [name]  Electronically Signed by Clearence Cheek, MD on 01/28/2018 at 9:09 PM  Electronically Signed by Buel Ream MD -[facility]- on 01/30/2018 at 8:36 AM  Electronically Signed by Cecile Hearing MD -[facility]- on 02/05/2018 at 9:46 AM  ________________________________________________________________________

## 2018-01-31 NOTE — Progress Notes (Signed)
General Medicine Visit - Resident note with preceptor addendum  Service Due by Standard Protocol Rules: PHQ2 SCORE, PATPORTALPIN.    [name]  Initial Screening   * [place]: Bentley (Ky[place])  Ht: 70.5 in.  Wt: 149.4 lbs.   BMI: 21.21  Temp: 98.6 deg F.     BP (Initial [place] Screening): 132 / 80      BP (Rechecked, Actionable): 132 /   80 mmHg   HR: 72     Travel outside of the [place] in past 28 days:: Yes  .  Med List: PRINTED by [facility] for patient   Chronic Pain Assessment: Does pt experience chronic pain ? YES  Severity of pain? (min=0, max=10) 3  Smoking Assessment: Tobacco use? never smoker  .  .  .  Falls Risk Assessment:   In the past year, have you ... Had no falls  Difficulty with balance? NO  Need assistance with ambulation while here? NO  .  Comments: .......................................Marland KitchenAngelina Sheriff  January 28, 2018 1:22 PM  .  .  **Resident Note**  .  * Preceptor: Sathi (Kinji)  CC: back pain  .  HPI: Back pain--Left flank pain. began 2-3 months ago. It is worse with cer  tain movements. It usually does not radiate. Occassionally it radiates to l  eft groin. No associated urinary symptoms. Takes tylenol, motrin, aleve occ  assionally for pain relief.   [name]  ?Black stools--flecks of black in stool. Most of stool is brown. No diarrhe  a. Does have recent NSAID use, no other risk factors for UGIB. No dizziness  /light headedness. Has been going on for 2-3 weeks.   .  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 He did n  ot notice any blood in his urine. Had normal urine dip at urology 1 month a  go. He did have hematospermia at that prompted that visit.   .  .  Past Medical History:(reviewed)  chronic prostatis  .  .  .  Social History: (reviewed)   Rare EtOH use  Monogamous with wife, previous negative testing for STIs, including negativ  e HIV  .  [name]  Review of Systems: ROS  Gen: No F/C   Eye: No diplopia, vision  loss  Ears/Nose/Throat: No tinnitus or hearing loss  Cardiovascular: No CP, syncope  Respiratory: No cough, dyspnea  Gastrointestinal: No N/V/D/C, abd pain  Genitourinary: No dysuria, hematuria, urinary freq  Musculoskeletal: + back pain, no jt pain or swelling  Skin: No rash, susp lesions  Neurologic: No weakness, HA  Endocrine: No polydipsia/uria, wt change  Heme/Lymphatic: No abnormal bruising, bleeding, enlarged lymph nodes  All other systems unremarkable  .  .  .  PROBLEMS:   PROBLEM CHANGES:  Added new problem of BLACK STOOLS (ICD-578.1) (GLO75-I43.3) - Signed  Added new problem of SCREENING FOR DIABETES MELLITUS (ICD-V77.1) (IRJ18-A41  .1)  Added new problem of LIPID DISORDER SCREENING (ICD-V77.91) (ICD10-Z13.220)  Added new problem of HEMATURIA, UNSPECIFIED (ICD-599.70) (YSA63-K16.9)  Added new problem of BACK PAIN < 3 MONTHS (ICD-724.5) (ICD10-M54.9)  Added new problem of HEMATOSPERMIA ([facility]-932.35) (ICD10-R36.1)  Problems Reviewed:  Done  .  [name]  MEDICATIONS:   No Known Medications  Medications Reviewed:  Done  .  [name]  ALLERGIES:   No Known Allergies  Allergies Reviewed:  Done  .  [name]  DIRECTIVES:   .  [name]  [name]  Vitals:   BP: 132 / 80 mmHg Ht: 70.5 in.  Wt: 149.4 lbs.  BMI (in-lb) 21.21  Temp: 98.6deg F.   Pulse Rate: 72 bpm  .  .  Additional PE: HEENT: PERRLA, EOMI, tympanic membranes normal bilaterally.   Normal nasal turbinates; MMM. No LAD. No thyromegaly. No conjunctival pallo  r appreciated  Lungs: Clear to auscultation bilaterally  Heart: Normal S1/S2. No murmurs, rubs or gallops appreciated  Abdomen: Soft, non tender. No organomegaly appreciated  Extremities: No edema, cyanosis. Pulses 2+.   Rectal: patient deferred  .  [name]  Assessment /T/ Plan:   Back pain--seems to be musculoskeletal in nature. Advised heat/ice, lidocai  ne patches, tylenol. No NSAIDs for now given ?black stools. Given his ?hema  turia, could be kidney stone but seems less likely given persistence of mil  d pain for 2 months, and worse with  movement.  [name]  ?Black stools--unclear if true melena based on history. Will check CBC, sto  ol gauaic. Avoid NSAIDS. if postive for blood, will start PPI and refer to   GI  .  Hematuria--will check UA, gonorrhea, chlamydia today  .  HCM--check lipids, HbA1c.   .  RTC in 2-4 weeks with PCP to establish care  .  .  .  * Resident Signature (.sign): ......................................Marland KitchenAshok Pall MD -KSa-  January 28, 2018 2:51 PM  .  .  .  ORDERS:  iFOBT kit for Other Screen (GI bleed) [CPT-82273]  CBC  [CPT-85027]  Hemoglobin A1C (Glycohemoglobin) [CPT-83036]  Lipid Profile-Chol, HDL, Trig, LDL(calc) [CPT-80061]  Urinalysis with microscopic  [CPT-80243]  Gonorrhea DNA Probe [CPT-87591]  Chlamydia probe [CPT-86631]  RTC in 4 weeks [RTC-028]  New Level 4 [CPT-99204]  GC - Res+Preceptor [Mod-GC]  .  [name]  **Preceptor Note**  Resident: Ernestina Penna Alycia Rossetti)  Problem New Patient  .  [name]  Subjective   Patient is a 42 Years Old Male who presents to clinic for new pt visit.    [name]  Notes diagnosis of chronic prostatitis in the past with urology.  No urinar  y symptoms.    [facility] urology here for hematospermia who provided reassurance.  .  Notes intermittent left flank pain.  Occasionally radiates to groin.  Had u  rine dip with urology, which was normal, but states he had a urine dip a we  ek ago which showed blood.  .  Notes black, tarry stools for 2 weeks.  Was taking aleve/ibuprofen, has not   taken in a week.  Had EGD in the past, which was normal.   I discussed the patient with Dr. Ernestina Penna Alycia Rossetti) in a routine precepting enc  ounter.  I also personally interviewed and examined the patient.  I agree w  ith the patient's diagnosis and management.   .  .  Objective   General:  Alert, NAD  Head:  Normocephalic, atraumatic  Eyes:  No conjunctival pallor  ENT:  Normal hearing; moist mucous membranes  Pulm:  Normal respiratory effort; clear to auscultation bilaterally, no whe  ezes, rales, or rhonchi  CVS:  nl S1 S2, no murmurs, rubs, or  gallops  .  .  see resident's note for additional details, see resident's note for details  .    [name]  Assessment   back pain  hematospermia  ?black stools  .  Plan   - conservative measures for back pain  - check U/A  - check CBC, stool guiaic, if concerning start  PPI and refer to GI  - avoid NSAIDs  - check A1c, lipids  See resident note for details, Follow-up per resident note, Return visit sc  heduled  .  Seen On Team (Floor):  4B  .  [name]  .  Patient Care Plan  .  .  .  Immunization Worksheet 2016   .  [name]  Electronically Signed by Clearence Cheek, MD on 01/28/2018 at 9:09 PM  Electronically Signed by Buel Ream MD -KSa- on 01/30/2018 at 8:36 AM  ________________________________________________________________________

## 2018-02-01 LAB — HX BF-OTHER: HX OCCULT BLOOD (IFOBT-COLORECTAL NEOPLASM): NEGATIVE

## 2018-02-15 ENCOUNTER — Ambulatory Visit

## 2018-02-15 ENCOUNTER — Ambulatory Visit: Admitting: Internal Medicine

## 2018-02-15 ENCOUNTER — Ambulatory Visit: Admit: 2018-02-15 | Payer: No Typology Code available for payment source

## 2018-02-15 NOTE — Progress Notes (Signed)
General Medicine Visit - Resident note with preceptor addendum  Service Due by Standard Protocol Rules:   .  Initial Screening   * [place]: Chan (Angela)  Ht: 70.5 in.  Wt: 252.6 lbs.   BMI: 35.86  with shoes  Temp: 99.0 deg F.     BP (Initial [place] Screening): 130 / 84      BP (Rechecked, Actionable): 120 /   84 mmHg   HR: 77     .  Med List: PRINTED byfor patient   Chronic Pain Assessment: Does pt experience chronic pain ? YES  Severity of pain? (min=0, max=10) 3  Smoking Assessment: Tobacco use? former smoker  Smoke type? cigarette  .  .  .  Falls Risk Assessment:   In the past year, have you ... Had no falls  Difficulty with balance? NO  Need assistance with ambulation while here? NO  .  Behavioral Health Tools:   PHQ2 Screening Score 0  .  Comments: .....................................Marland KitchenMarland KitchenAdline Peals  Feb 15, 2018   1:35 PM  .  .  **Resident Note**  .  * Preceptor: Marvia Pickles)  CC: establish care  .  HPI: 42M with a chronic prostatitis here with 3 months of back pain here to   establish care. Seen by Dr. Ernestina Penna recently. His pain started after lifit  ing a heavy object, is described as sharp, located mostly on the right lowe  r back, does not radiate (with bending, walking), bothersome at night (but   can lay flat), no problems urinating/defecating, associated with numbness/w  eakness in legs bilaterally (once per week) that is worse with exertion. Pa  tient reports dark stool (has been on Alleve and Ibuprofen for this pain),   but his stool guaiac was negative. Denies weight loss, worsening of the pai  n at night, chest pain, shortness of breath, fevers, chills, nausea, emesis   or other B symptoms, joint pain, rashes, photosensitivity. Of note he repo  rts that two before seeing [place] last, his pain radiated to his groin and thou  ght it was prostatitis and took cipro/doxy, but this type of pain has not p  resisted.  .  .  Past Medical History:  chronic prostatis   .  Family History:   HTN, DM  .  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  .  .  .  [name]  PROBLEMS:   PAST MEDICAL HISTORY (prior to today's visit):  HEMATOSPERMIA (ICD-608.82) (ICD10-R36.1)  BACK PAIN < 3 MONTHS (ICD-724.5) (ICD10-M54.9)  HEMATURIA, UNSPECIFIED (ICD-599.70) (ICD10-R31.9)  LIPID DISORDER SCREENING (ICD-V77.91) (ICD10-Z13.220)  SCREENING FOR DIABETES MELLITUS (ICD-V77.1) (ICD10-Z13.1)  BLACK STOOLS (ICD-578.1) (ZOX09-U04.1)  PROBLEM CHANGES:  Added new problem of BACK PAIN (ICD-724.5) (ICD10-M54.9)  .  [name]  MEDICATIONS:   .  [name]  ALLERGIES:   .  [name]  DIRECTIVES:   .  .  .  Vitals:   BP: 120 / 84 mmHg Ht: 70.5 in.  Wt: 252.6 lbs.  BMI (in-lb) 35.86  Temp: 99.0deg F.   Pulse Rate: 77 bpm  .  .  Additional PE: Appearance: well appearing; Pain Scale=0  Skin: no significant lesions  HEENT: AT, PERRLA, EOMI, no erythema, discharge or other lesions  CV: RRR, no m/r/g  Lungs: CTAB  Abd: soft, nondistended, +BS, NTTP, no HSmegaly, flank NTTP, no rebound, no   involuntary rigidity  Musculoskeletal: normal mass and strength, passive tone normal, full ROM  Neuro: AAO x 3, DTR: normal reflexes and equal bilaterally; no clonus, bala  nce: within normal limits, gait: within normal limits,   .  .  .  Assessment /T/ Plan:   42M with back pain for > 3 months, differential includes MSK-related injury   and less likely: neuropathy or prostatitis.  Dx:  - cancer screenings not indicated, stool guiaic neg  - UA neg  - CPK likely normal if corrected for race  - f/u back xray  - consider rheum panel  - consider MRI  Tx:  - f/u physical therapy   - will reassess in 6 weeks  .  #Health Maintenance:  Vaccines: up-to-date  Diet/exercise: had an extensive talk about balancing  diet, increasing physi  cal activity  Screening:  -CV/Lipids: done at last visit, no indication for medical therapy; will f/u   lifestyle changes  .  .  .  .  * Resident Signature (.sign): Cecile Hearing, MD  .  ORDERS:  XR Spine Lumbar (Flex and Ext) [CPT-99999]  DX Hip Bilateral - AP + Lateral w/ Pelvis [CPT-73510]  XR Spine Lumbar AP /T/ Lat (2-3) [CPT-99999]  RTC (Return to Clinic) [RTC-000]  Est Level 4 [CPT-99214]  .  [name]  **Preceptor Note**  Resident: Cecile Hearing)  Problem Physical Exam  .  .  Subjective   Patient is a 42 Years Old Male who presents to clinic here for f/u LBP ongo  ing for 4-5 months starting after he was lifting sheet rock and   Sharp, gnawing pain. Once radiated to the groin but that did not persist. N  o f/c, wt loss, other joint issues.  Takes nsaids w some relief but not any  more. Has some intermittent numbness of both thighs, no particular triggers  .    I discussed the patient with Dr. Allena Katz Erline Levine) in a routine precepting enco  unter.  I also personally interviewed and examined the patient.  I agree wi  th the patient's diagnosis and management.   .  .  Objective   Gen: alert, no distress  HEENT: mmm, sclera anicteric, no lad  CV: rrr, no m/r/g  Resp: lungs ctab  Back: no midline ttp, mild paraspinal tenderness on R side to palpation  Psych: pleasant, affect appropriate  see resident's note for additional details.    .  Assessment   42M here months of back pain   .  Plan   back pain - possible msk, refer to PT; gave handout as well ; advised stret  ching, heating pad, wt loss  prediabetes: Counseled on diet and exercise  .  See resident note for details, Follow-up per resident note  .  Seen On Team (Floor):  4B  .  .  .......................................Pernell Dupre, MD  Feb 17, 2018 9:42 PM  .  Patient Care Plan  .  .  .  Immunization Worksheet 2016   .  .  [name]  Electronically Signed by Pernell Dupre, MD on 02/17/2018 at 9:43  PM  ________________________________________________________________________

## 2018-02-15 NOTE — Progress Notes (Signed)
General Medicine Visit - Resident note with preceptor addendum  Service Due by Standard Protocol Rules:   .  Initial Screening   * [place]: Chan (Angela)  Ht: 70.5 in.  Wt: 252.6 lbs.   BMI: 35.86  with shoes  Temp: 99.0 deg F.     BP (Initial [place] Screening): 130 / 84      BP (Rechecked, Actionable): 120 /   84 mmHg   HR: 77     .  Med List: PRINTED by [facility] for patient   Chronic Pain Assessment: Does pt experience chronic pain ? YES  Severity of pain? (min=0, max=10) 3  Smoking Assessment: Tobacco use? former smoker  Smoke type? cigarette  .  .  .  Falls Risk Assessment:   In the past year, have you ... Had no falls  Difficulty with balance? NO  Need assistance with ambulation while here? NO  .  Behavioral Health Tools:   PHQ2 Screening Score 0  .  Comments: .....................................Marland KitchenMarland KitchenAdline Peals  Feb 15, 2018   1:35 PM  .  .  **Resident Note**  .  * Preceptor: Marvia Pickles)  CC: establish care  .  HPI: 60M with a chronic prostatitis here with 3 months of back pain here to   establish care. Seen by Dr. Ernestina Penna recently. His pain started after lifit  ing a heavy object, is described as sharp, located mostly on the right lowe  r back, does not radiate (with bending, walking), bothersome at night (but   can lay flat), no problems urinating/defecating, associated with numbness/w  eakness in legs bilaterally (once per week) that is worse with exertion. Pa  tient reports dark stool (has been on Alleve and Ibuprofen for this pain),   but his stool guaiac was negative. Denies weight loss, worsening of the pai  n at night, chest pain, shortness of breath, fevers, chills, nausea, emesis   or other B symptoms, joint pain, rashes, photosensitivity. Of note he repo  rts that two before seeing [place] last, his pain radiated to his groin and thou  ght it was prostatitis and took cipro/doxy, but this type of pain has not p  resisted.  .  .  Past Medical History:  chronic prostatis   .  Family History:   HTN, DM  .  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  .  .  .  [name]  PROBLEMS:   PAST MEDICAL HISTORY (prior to today's visit):  HEMATOSPERMIA (ICD-608.82) (ICD10-R36.1)  BACK PAIN < 3 MONTHS (ICD-724.5) (ICD10-M54.9)  HEMATURIA, UNSPECIFIED (ICD-599.70) (ICD10-R31.9)  LIPID DISORDER SCREENING (ICD-V77.91) (ICD10-Z13.220)  SCREENING FOR DIABETES MELLITUS (ICD-V77.1) (ICD10-Z13.1)  BLACK STOOLS (ICD-578.1) (UJW11-B14.1)  PROBLEM CHANGES:  Added new problem of BACK PAIN (ICD-724.5) (ICD10-M54.9)  .  [name]  MEDICATIONS:   .  [name]  ALLERGIES:   .  [name]  DIRECTIVES:   .  .  .  Vitals:   BP: 120 / 84 mmHg Ht: 70.5 in.  Wt: 252.6 lbs.  BMI (in-lb) 35.86  Temp: 99.0deg F.   Pulse Rate: 77 bpm  .  .  Additional PE: Appearance: well appearing; Pain Scale=0  Skin: no significant lesions  HEENT: AT, PERRLA, EOMI, no erythema, discharge or other lesions  CV: RRR, no m/r/g  Lungs: CTAB  Abd: soft, nondistended, +BS, NTTP, no HSmegaly, flank NTTP, no rebound, no   involuntary rigidity  Musculoskeletal: normal mass and strength, passive tone normal, full ROM  Neuro: AAO x 3, DTR: normal reflexes and equal bilaterally; no clonus, bala  nce: within normal limits, gait: within normal limits,   .  .  .  Assessment /T/ Plan:   70M with back pain for > 3 months, differential includes MSK-related injury   and less likely: neuropathy or prostatitis.  Dx:  - cancer screenings not indicated, stool guiaic neg  - UA neg  - CPK likely normal if corrected for race  - f/u back xray  - consider rheum panel  - consider MRI  Tx:  - f/u physical therapy   - will reassess in 6 weeks  .  #Health Maintenance:  Vaccines: up-to-date  Diet/exercise: had an extensive talk about balancing  diet, increasing physi  cal activity  Screening:  -CV/Lipids: done at last visit, no indication for medical therapy; will f/u   lifestyle changes  .  .  .  .  * Resident Signature (.sign): Cecile Hearing, MD  .  ORDERS:  XR Spine Lumbar (Flex and Ext) [CPT-99999]  DX Hip Bilateral - AP + Lateral w/ Pelvis [CPT-73510]  XR Spine Lumbar AP /T/ Lat (2-3) [CPT-99999]  RTC (Return to Clinic) [RTC-000]  Est Level 4 [CPT-99214]  .  [name]  **Preceptor Note**  Resident: Cecile Hearing)  Problem Physical Exam  .  .  Subjective   Patient is a 42 Years Old Male who presents to clinic here for f/u LBP ongo  ing for 4-5 months starting after he was lifting sheet rock and   Sharp, gnawing pain. Once radiated to the groin but that did not persist. N  o f/c, wt loss, other joint issues.  Takes nsaids w some relief but not any  more. Has some intermittent numbness of both thighs, no particular triggers  .    I discussed the patient with Dr. Allena Katz Erline Levine) in a routine precepting enco  unter.  I also personally interviewed and examined the patient.  I agree wi  th the patient's diagnosis and management.   .  .  Objective   Gen: alert, no distress  HEENT: mmm, sclera anicteric, no lad  CV: rrr, no m/r/g  Resp: lungs ctab  Back: no midline ttp, mild paraspinal tenderness on R side to palpation  Psych: pleasant, affect appropriate  see resident's note for additional details.    .  Assessment   70M here months of back pain   .  Plan   back pain - possible msk, refer to PT; gave handout as well ; advised stret  ching, heating pad, wt loss  prediabetes: Counseled on diet and exercise  .  See resident note for details, Follow-up per resident note  .  Seen On Team (Floor):  4B  .  .  .......................................Pernell Dupre, MD  Feb 17, 2018 9:42 PM  .  Patient Care Plan  .  .  .  Immunization Worksheet 2016   .  [name]  Electronically Signed by Pernell Dupre, MD on 02/17/2018 at 9:43 PM  Electronically Signed by Cecile Hearing MD -DA- on 02/18/2018  at 11:42 AM  ________________________________________________________________________

## 2018-02-15 NOTE — Progress Notes (Signed)
General Medicine Visit - Resident note with preceptor addendum  Service Due by Standard Protocol Rules:     Initial Screening   * [place]: Chan (Angela)  Ht: 70.5 in.  Wt: 252.6 lbs.   BMI: 35.86  with shoes  Temp: 99.0 deg F.     BP (Initial [place] Screening): 130 / 84      BP (Rechecked, Actionable): 120 / 84 mmHg   HR: 77       Med List: PRINTED by [facility] for patient   Chronic Pain Assessment: Does pt experience chronic pain ? YES  Severity of pain? (min=0, max=10) 3  Smoking Assessment: Tobacco use? former smoker  Smoke type? cigarette        Falls Risk Assessment:   In the past year, have you ... Had no falls  Difficulty with balance? NO  Need assistance with ambulation while here? NO    Behavioral Health Tools:   PHQ2 Screening Score 0    Comments: .....................................Marland KitchenMarland KitchenAdline Peals  Feb 15, 2018 1:35 PM         **Resident Note**    * Preceptor: Deno Etienne Annice Pih)  CC: establish care    HPI: 42M with a chronic prostatitis here with 3 months of back pain here to establish care. Seen by Dr. Ernestina Penna recently. His pain started after lifiting a heavy object, is described as sharp, located mostly on the right lower back, does not radiate (with bending, walking), bothersome at night (but can lay flat), no problems urinating/defecating, associated with numbness/weakness in legs bilaterally (once per week) that is worse with exertion. Patient reports dark stool (has been on Alleve and Ibuprofen for this pain), but his stool guaiac was negative. Denies weight loss, worsening of the pain at night, chest pain, shortness of breath, fevers, chills, nausea, emesis or other B symptoms, joint pain, rashes, photosensitivity. Of note he reports that two before seeing [place] last, his pain radiated to his groin and thought it was prostatitis and took cipro/doxy, but this type of pain has not presisted.      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, previous negative testing for STIs, including negative HIV  Vaccines: uptodate  Smoking: 1/2 pack year history in teens  Other drugs: none  Diet: not balanced: higher weight on fats, carbs,   Exercise:  Travel: travels within country and out to other first world countries every week            PROBLEMS:   PAST MEDICAL HISTORY (prior to today's visit):  HEMATOSPERMIA (ICD-608.82) (ICD10-R36.1)  BACK PAIN < 3 MONTHS (ICD-724.5) (ICD10-M54.9)  HEMATURIA, UNSPECIFIED (ICD-599.70) (ICD10-R31.9)  LIPID DISORDER SCREENING (ICD-V77.91) (ICD10-Z13.220)  SCREENING FOR DIABETES MELLITUS (ICD-V77.1) (ICD10-Z13.1)  BLACK STOOLS (ICD-578.1) (ZOX09-U04.1)  PROBLEM CHANGES:  Added new problem of BACK PAIN (ICD-724.5) (ICD10-M54.9)         MEDICATIONS:          ALLERGIES:          DIRECTIVES:         Vitals:   BP: 120 / 84 mmHg Ht: 70.5 in.  Wt: 252.6 lbs.  BMI (in-lb) 35.86  Temp: 99.0deg F.   Pulse Rate: 77 bpm      Additional PE: Appearance: well appearing; Pain Scale=0  Skin: no significant lesions  HEENT: AT, PERRLA, EOMI, no erythema, discharge or other lesions  CV: RRR, no  m/r/g  Lungs: CTAB  Abd: soft, nondistended, +BS, NTTP, no HSmegaly, flank NTTP, no rebound, no involuntary rigidity  Musculoskeletal: normal mass and strength, passive tone normal, full ROM  Neuro: AAO x 3, DTR: normal reflexes and equal bilaterally; no clonus, balance: within normal limits, gait: within normal limits,         Assessment & Plan:   21M with back pain for > 3 months, differential includes MSK-related injury and less likely: neuropathy or prostatitis.  Dx:  - cancer screenings not indicated, stool guiaic neg  - UA neg  - CPK likely normal if corrected for race  - f/u back xray  - consider rheum panel  - consider MRI  Tx:  - f/u physical therapy   - will reassess in 6 weeks    #Health Maintenance:  Vaccines: up-to-date  Diet/exercise: had an extensive talk about balancing diet, increasing physical activity  Screening:  -CV/Lipids: done  at last visit, no indication for medical therapy; will f/u lifestyle changes          * Resident Signature (.sign): Cecile Hearing, MD    ORDERS:  XR Spine Lumbar (Flex and Ext) [CPT-99999]  DX Hip Bilateral - AP + Lateral w/ Pelvis [CPT-73510]  XR Spine Lumbar AP & Lat (2-3) [CPT-99999]  RTC (Return to Clinic) [RTC-000]  Est Level 4 [UJW-11914]      **Preceptor Note**     Resident: Allena Katz Erline Levine)  Problem Physical Exam      Subjective   Patient is a 42 Years Old Male who presents to clinic here for f/u LBP ongoing for 4-5 months starting after he was lifting sheet rock and   Sharp, gnawing pain. Once radiated to the groin but that did not persist. No f/c, wt loss, other joint issues.  Takes nsaids w some relief but not anymore. Has some intermittent numbness of both thighs, no particular triggers.    I discussed the patient with Dr. Allena Katz Erline Levine) in a routine precepting encounter.  I also personally interviewed and examined the patient.  I agree with the patient's diagnosis and management.       Objective   Gen: alert, no distress  HEENT: mmm, sclera anicteric, no lad  CV: rrr, no m/r/g  Resp: lungs ctab  Back: no midline ttp, mild paraspinal tenderness on R side to palpation  Psych: pleasant, affect appropriate  see resident's note for additional details.      Assessment   21M here months of back pain     Plan   back pain - possible msk, refer to PT; gave handout as well ; advised stretching, heating pad, wt loss  prediabetes: Counseled on diet and exercise    See resident note for details, Follow-up per resident note    Seen On Team (Floor):  4B      .......................................Pernell Dupre, MD  Feb 17, 2018 9:42 PM    Patient Care Plan        Immunization Worksheet 2016           Created By Adline Peals on 02/15/2018 at 01:33 PM    Electronically Signed By Pernell Dupre, MD on 02/17/2018 at 09:43 PM

## 2018-02-19 ENCOUNTER — Ambulatory Visit

## 2018-02-19 NOTE — Progress Notes (Signed)
[facility]  [address]  [place] [address]  Main: [phone]  Fax: [phone]  Patient Portal: [email or web address]      Feb 19, 2018            MRN: [id number]            DOB: 1976/05/14   Gilbert Stewart   [address]   [place] [address]      The results of your recent tests are as follows:      Dear [name],    Your back x-ray was normal. Please continue to pursue physical therapy for your back pain.       Sincerely,       Cecile Hearing MD -DA-  [facility]    Results Provided by: Mail           Created By Cecile Hearing MD -DA 6A- on 02/19/2018 at 01:00 PM    Electronically Signed By Cecile Hearing MD -DA 6A- on 02/19/2018 at 01:02 PM

## 2018-02-28 ENCOUNTER — Ambulatory Visit (HOSPITAL_BASED_OUTPATIENT_CLINIC_OR_DEPARTMENT_OTHER): Admitting: Psychiatry

## 2018-08-19 ENCOUNTER — Ambulatory Visit

## 2018-08-19 NOTE — Telephone Encounter (Signed)
Call Details:   Patient PCP = Cecile Hearing MD -DA 6A-  Gilbert Stewart (Patient) called on August 19, 2018 2:53 PM.  Message taken by: Albertina Senegal  Primary call-back number: [phone]    Secondary call-back number: () -    Call Reason(s): Message/Call-Back and Other      ** MESSAGE / CALL-BACK.  Regarding: pt is requesting to speak w/ nurse or PCP regarding last lab amd xray results from last OV.     ---------- ---------- ---------- ---------- ---------- ----------       RESPONSE/ORDERS:    Lm to call back clinic  ......................................Marland KitchenKandis Fantasia, RN  August 19, 2018 3:25 PM    pt returning call. .........................................Marland KitchenAlbertina Senegal  August 19, 2018 3:33 PM    spoke w/ the pt states he realized he never received his lab and xray results from the last office visit  I reviewed the results letter  read pcp recommendations. pt would like toknow if he needs to have a of f/u appt    or can wait to repeat labs w/ next yrs PE        pt  would like to reset or have info on getting a PIN for the portal  [name]  the pt said he moved  pls make sure we have his correct address    thanks         ......................................Marland KitchenKandis Fantasia, RN  August 19, 2018 3:52 PM    VJ Please let him know  Can wait to repeat labs with next years PE as long as he is feeling well  ......................................Marland KitchenBallard Russell, MD  August 19, 2018 4:15 PM    pt will wait for PE  he has not had a chance to focus on diet and exercise because was not aware of the results and pcp recommendation  ......................................Marland KitchenKandis Fantasia, RN  August 19, 2018 4:43 PM    s/w pt and gave him a new pt portal pin  .....................................Marland KitchenMarland KitchenAntonio Ranger  August 20, 2018 1:27 PM                 ORDERS/PROBS/MEDS/ALL     Problems:   BACK PAIN (ICD-724.5) (ICD10-M54.9)  HEMATOSPERMIA (ICD-608.82) (ICD10-R36.1)  BACK PAIN < 3  MONTHS (ICD-724.5) (ICD10-M54.9)  HEMATURIA, UNSPECIFIED (ICD-599.70) (ICD10-R31.9)  LIPID DISORDER SCREENING (ICD-V77.91) (ICD10-Z13.220)  SCREENING FOR DIABETES MELLITUS (ICD-V77.1) (ICD10-Z13.1)  BLACK STOOLS (ICD-578.1) (GNF62-Z30.1)            Created By Albertina Senegal on 08/19/2018 at 02:53 PM    Electronically Signed By Cecile Hearing MD -DA 6A- on 08/30/2018 at 05:33 AM

## 2018-08-20 ENCOUNTER — Ambulatory Visit

## 2018-12-14 IMAGING — DX DG CERVICAL SPINE COMPLETE 4+V
5 series · 5 of 5 positions shown · non-contrast
Comparison: None.

CLINICAL DATA: Right-sided neck pain radiating into the right arm
and hand.

EXAM:
CERVICAL SPINE - COMPLETE 4+ VIEW

[c-spine lat]
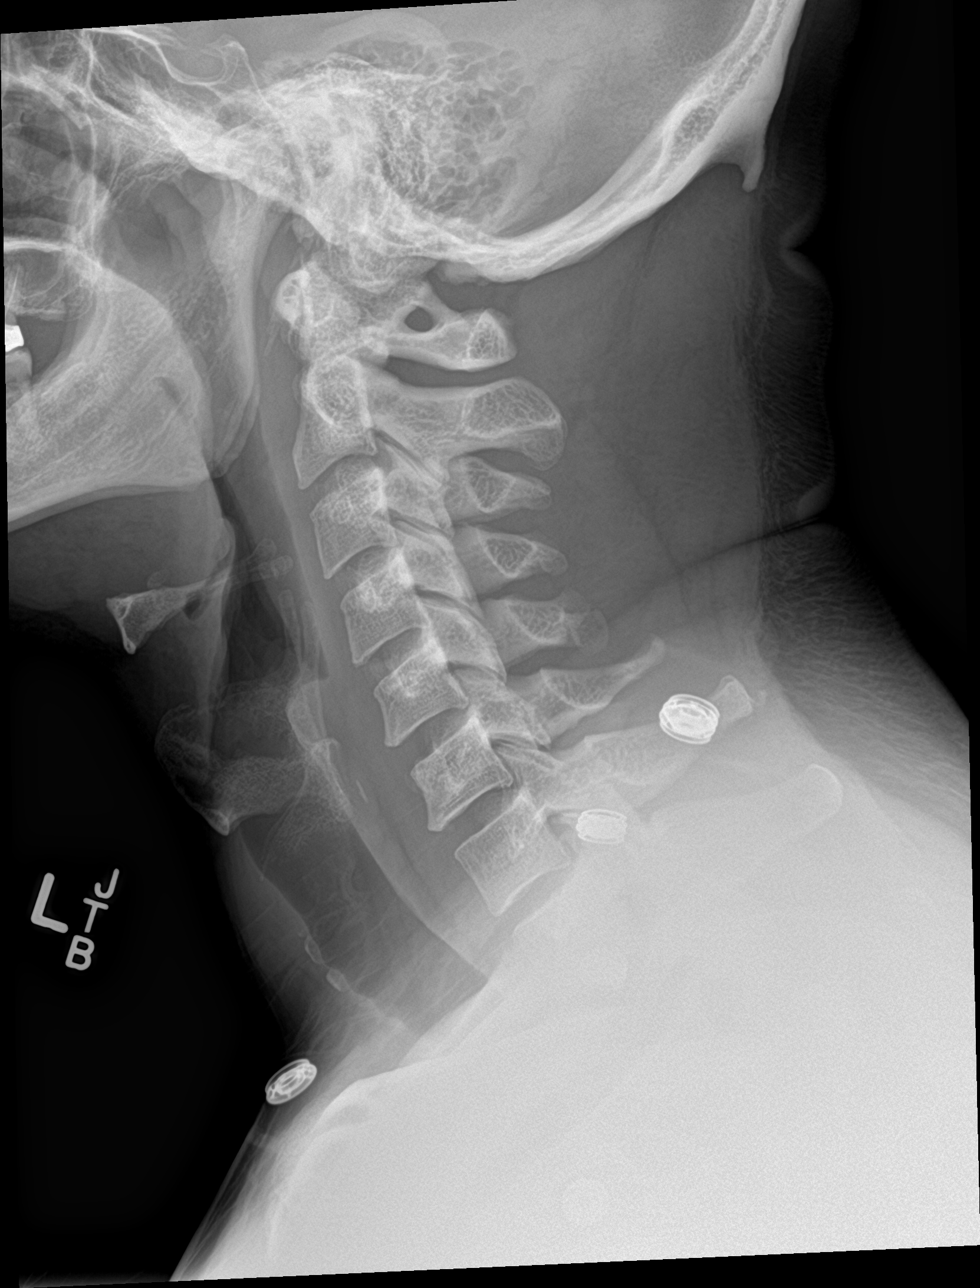

[c-spine obl (1 of 2)]
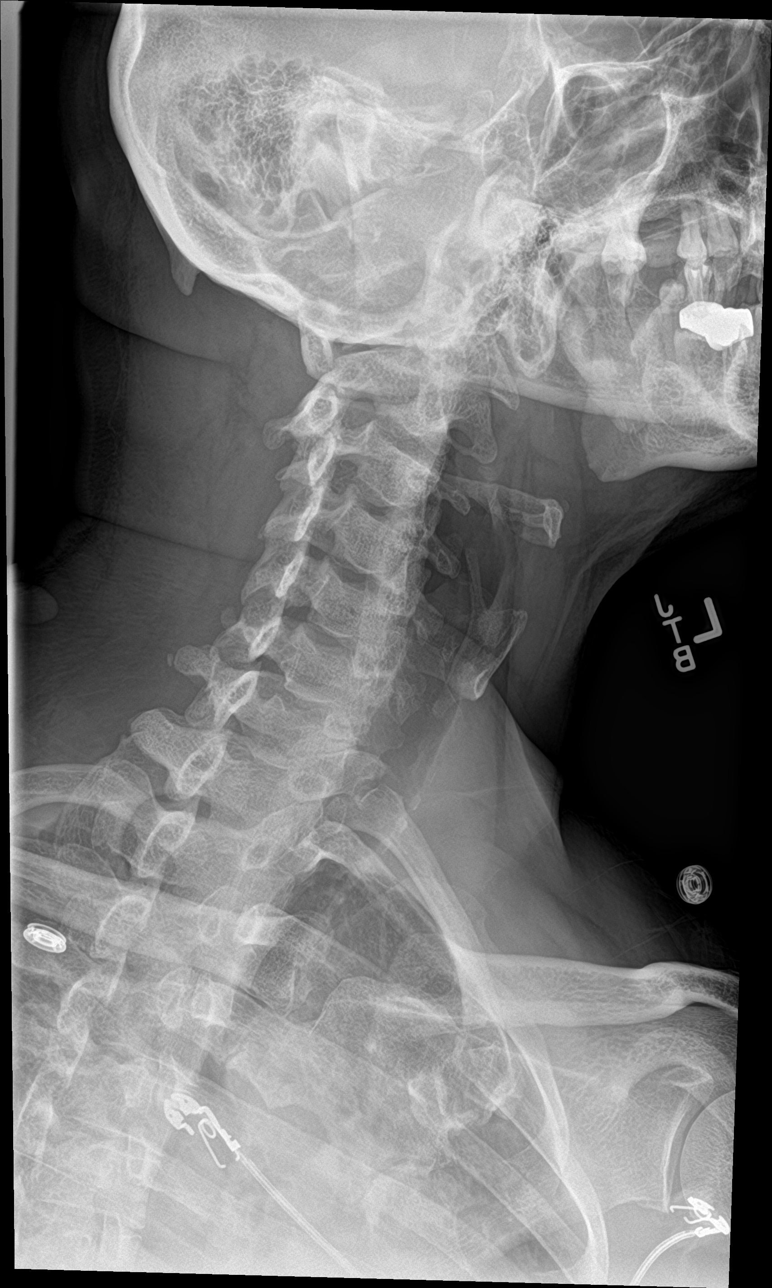

[c-spine obl (2 of 2)]
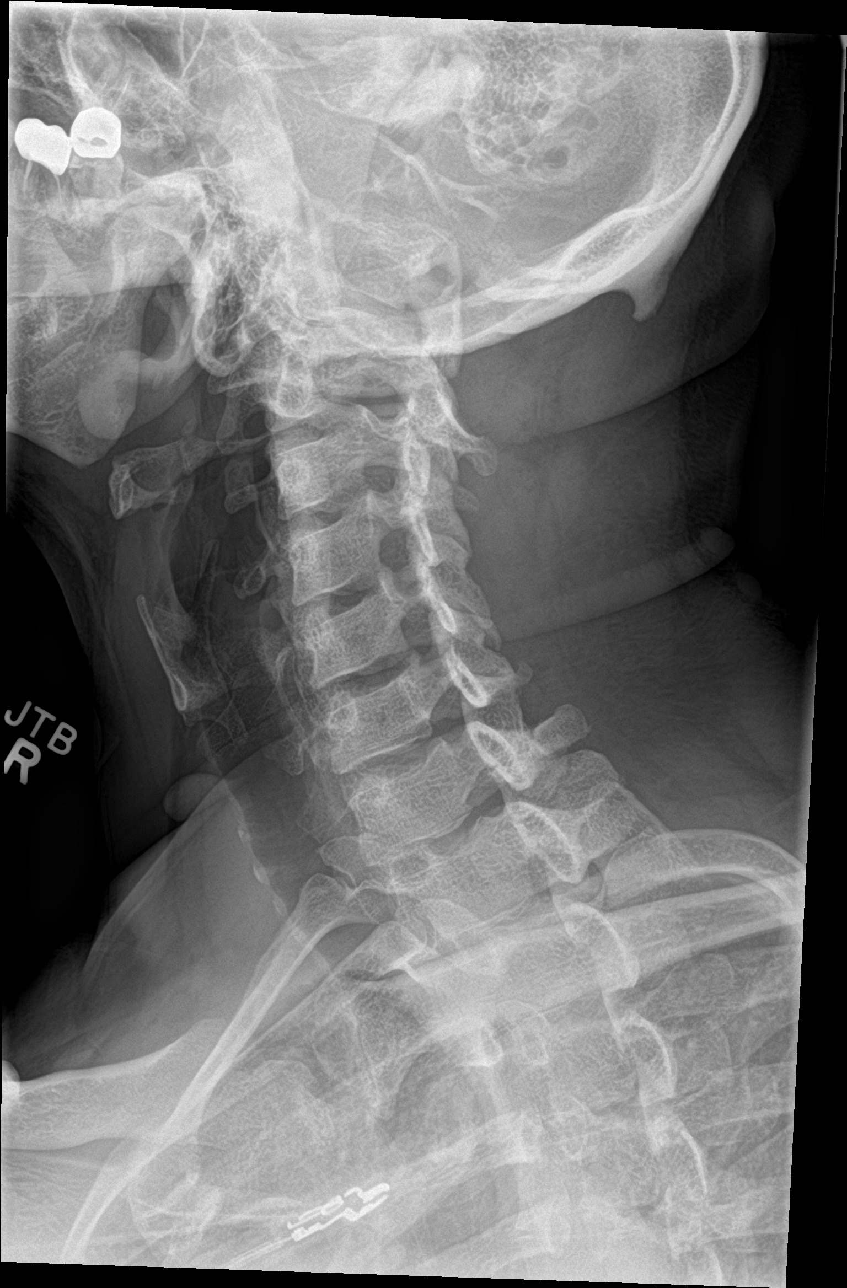

[c-spine ap]
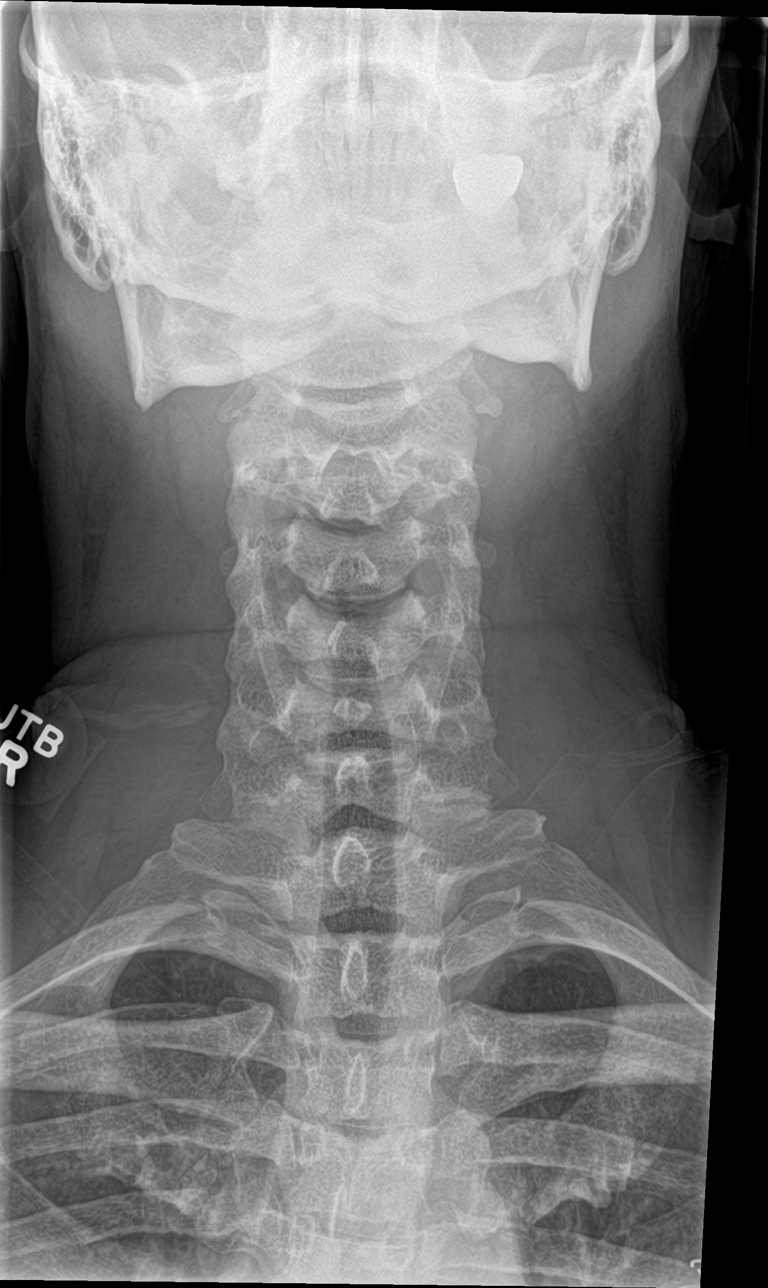

[c-spine open mouth]
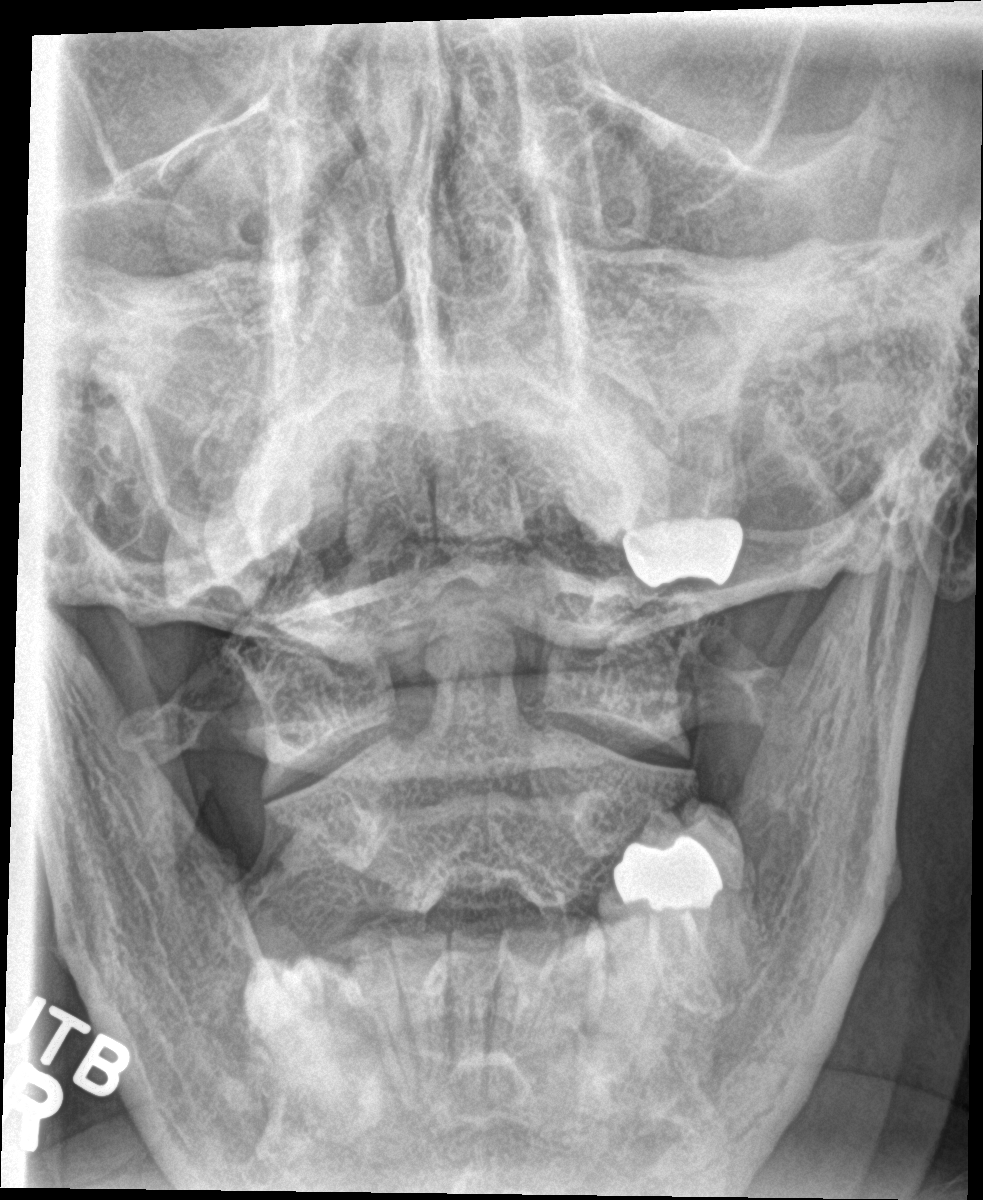

[5 of 5 positions shown; findings below may reference images not displayed]

FINDINGS: The lateral view is diagnostic to the C7-T1 level. There is no acute
fracture or subluxation. Vertebral body heights are preserved.
Alignment is normal. Interveterbral disc spaces are maintained. The
neural foramina are widely patent.Normal prevertebral soft tissues.
IMPRESSION: Negative cervical spine radiographs.

## 2018-12-14 IMAGING — DX DG CHEST 2V
2 series · 2 of 2 positions shown · non-contrast
Comparison: None.

CLINICAL DATA: Right shoulder and arm pain.

EXAM:
CHEST  2 VIEW

[chest pa]
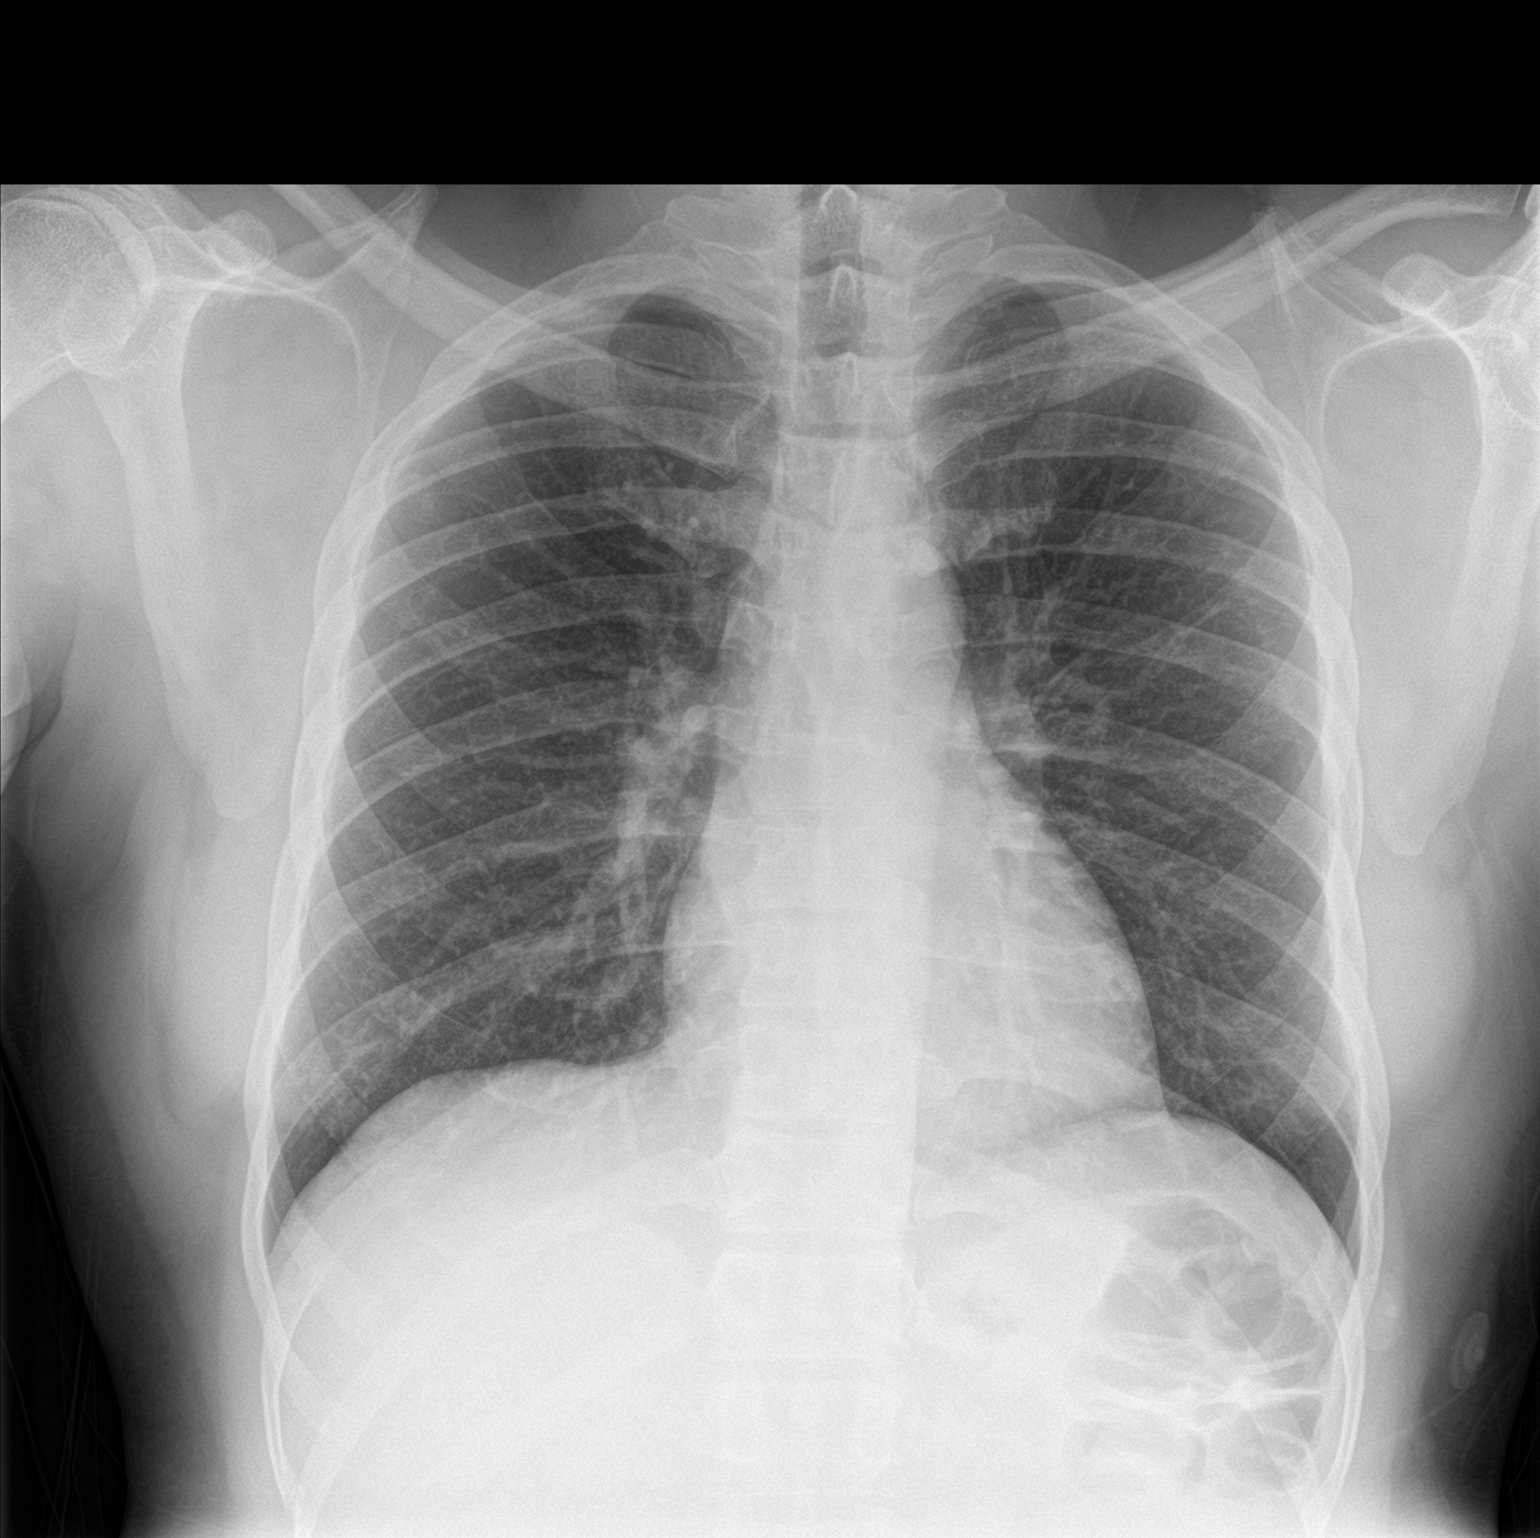

[chest lat]
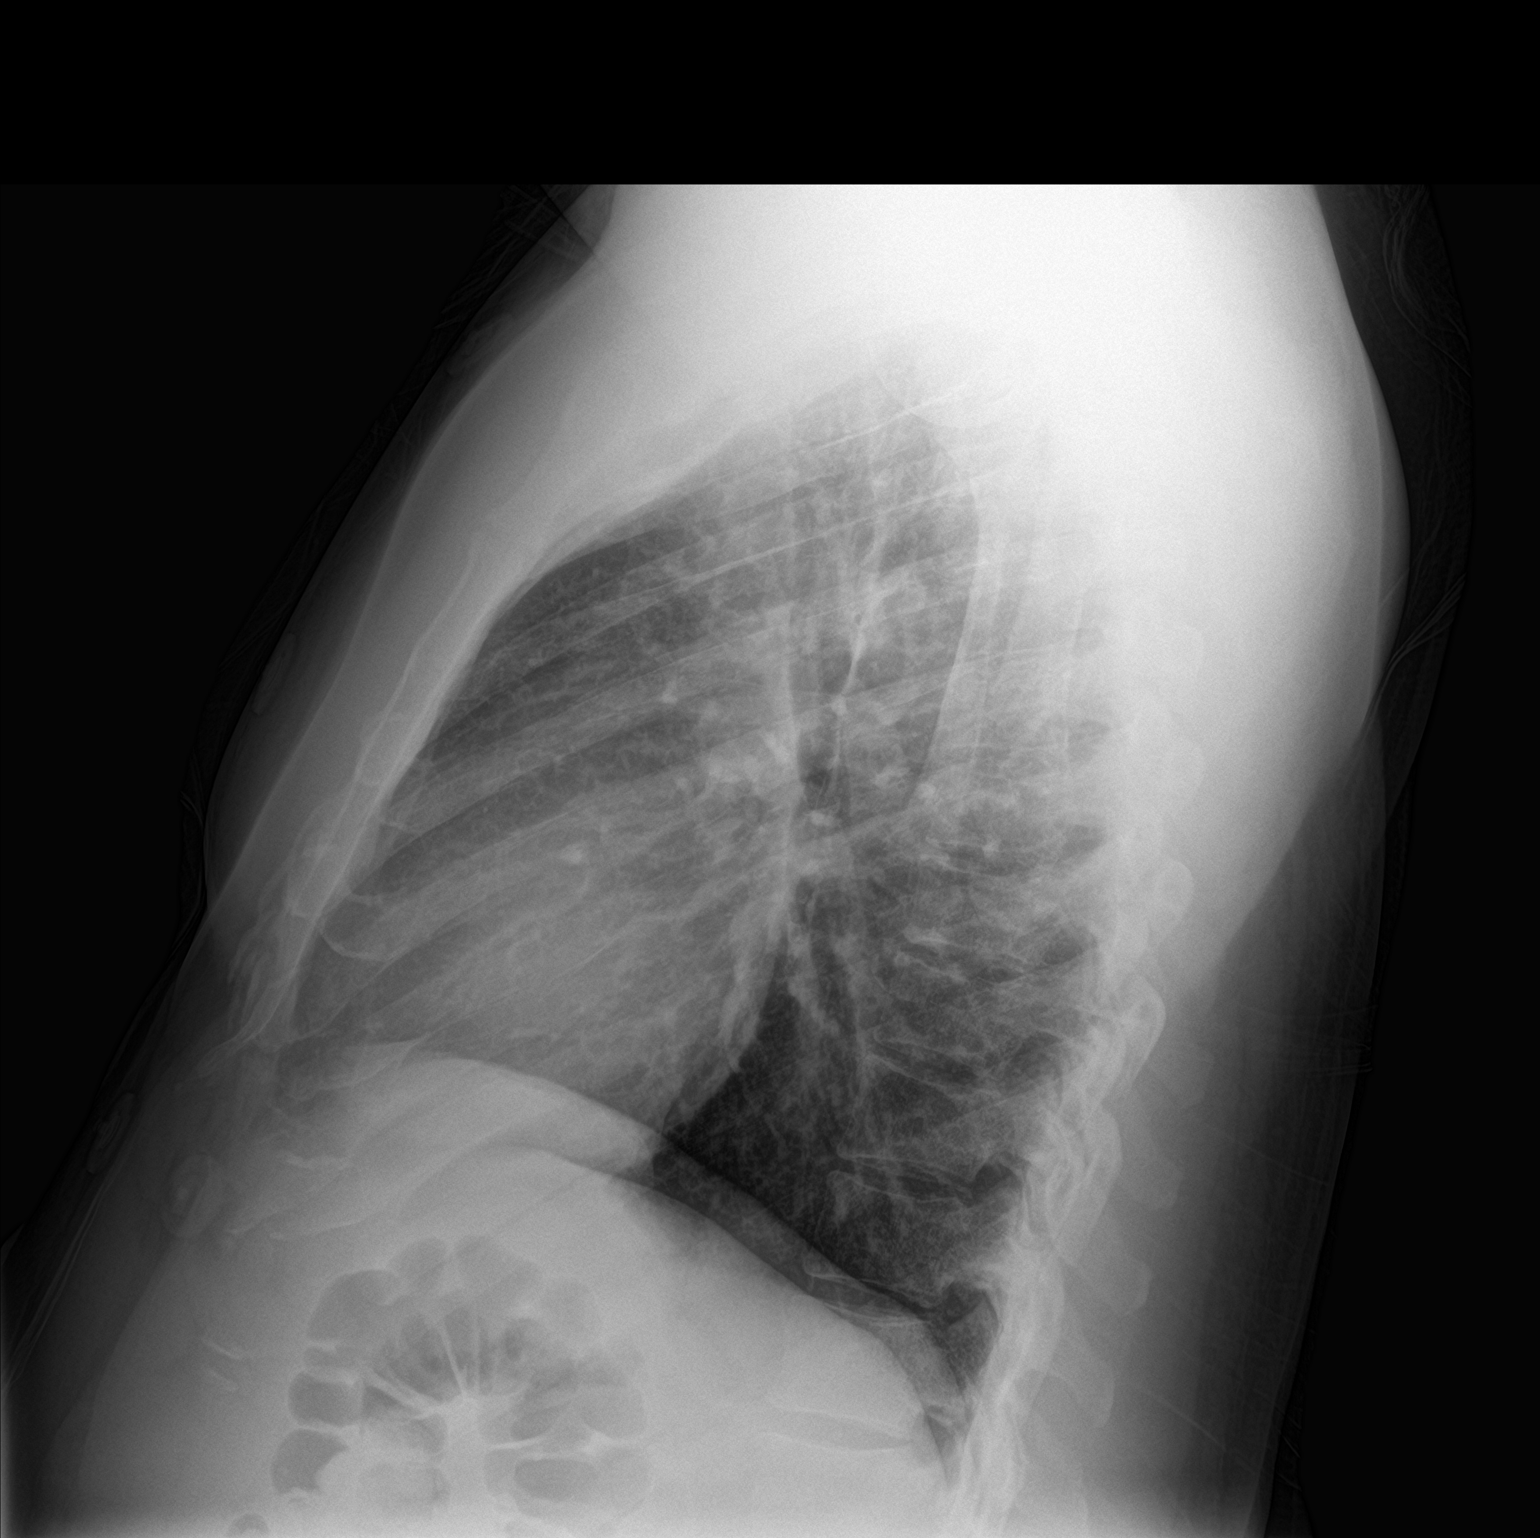

[2 of 2 positions shown; findings below may reference images not displayed]

FINDINGS: Heart and mediastinal contours are within normal limits. No focal
opacities or effusions. No acute bony abnormality.
IMPRESSION: No active cardiopulmonary disease.

## 2019-03-11 ENCOUNTER — Ambulatory Visit

## 2019-03-11 NOTE — Progress Notes (Signed)
 [facility] March 11, 2019  [address]  [place] [address]  Main: [phone]  Fax:   Patient Portal: [email or web address], [place]  [address]                DOB:           MR#: [id number]  Dear  [name],      I am writing this letter to inform you that I will be leaving [facility] as of April 04, 2019. 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    My continuity clinic practice will be taken over by Dr. Zetta Bills, a new intern who will begin on March 31, 2019. Please call my coordinator at [phone], in order to arrange an appointment to meet your new doctor.  If you have changed primary care physicians or left the area, please let us know so we can update our records.    It has been a pleasure serving you. I will miss being your doctor, and wish you good health in the future.      Sincerely,      Cecile Hearing MD -DA 6A-  Division of Internal Medicine  [facility]        Created By Maryan Puls on 03/11/2019 at 02:09 PM    Electronically Signed By Maryan Puls on 03/11/2019 at 02:09 PM

## 2020-02-28 ENCOUNTER — Ambulatory Visit

## 2020-02-28 NOTE — Telephone Encounter (Signed)
 [facility] has not seen you in a while [C001]    The following information was sent on 02/28/2020 8:45:48 AM and attached here.       - Secure message created from [facility] template.     - The secure message included no additional attachments.      _____________________________________________________________________    External Attachment:      Type:     Image      Comment:  Message Body    Created By  LinkLogic on 02/28/2020 at 08:49 AM    Electronically Signed By Ronie Spies, MD on 02/28/2020 at 08:49 AM

## 2020-06-01 ENCOUNTER — Ambulatory Visit

## 2020-06-01 NOTE — Telephone Encounter (Signed)
[facility] has not seen you in a while [C003]    The following information was sent on 06/01/2020 4:59:57 PM and attached here.       - Secure message created from [facility] template.     - The secure message included no additional attachments.      _____________________________________________________________________    External Attachment:      Type:     Image      Comment:  Message Body    Created By  LinkLogic on 06/01/2020 at 05:04 PM    Electronically Signed By Ronie Spies, MD on 06/01/2020 at 05:04 PM

## 2021-01-04 NOTE — Progress Notes (Signed)
* * *        Gilbert Stewart**    --- ---    45 Y old Male, DOB: [birth year], External MRN: [id number]    Account Number: [id number]    [id number]    Home: [phone]    Insurance: COMMERCIAL INSURANCE    PCP: Jonathon Bellows Referring: Jonathon Bellows    Appointment Facility: [facility]        * * *    12/27/2017  Progress Notes: Augustine Radar **CHN#:** [id number]    --- ---    ---         **Current Medications**    ---       None    ---       **Past Medical History**    ---       Hematospermia.        ---       **Surgical History**    ---       No Surgical History documented.    ---       **Family History**    ---       Mother: alive, diagnosed with Hypertension    ---    Father: deceased, car accident    ---    2 brother(s) , 2 sister(s) .    ---    sister has systemic lupus.    ---       **Social History**    ---    Tobacco    history: _Never smoked_    Alcohol    _Occasionally_       **Allergies**    ---       N.K.D.A.    ---       **Hospitalization/Major Diagnostic Procedure**    ---       No Hospitalization History.    ---       **Review of Systems**    ---     _Urology ROS_ :    Constitutional: No fevers, chills, weight loss or general weakness. Head: No  headache or dizziness. Eyes: No loss of vision or double vision. Ears: No  hearing loss, tinnitus, otalgia, otorrhea. Nose: No nasal discharge. Throat:  No hoarseness or difficulty swallowing. Integumentary: No rash or skin  lesions. No changes in hair or nail character. Lungs: No shortness of breath.  No new, frequent cough. Cardiovascular: No chest pain or palpitations.  Gastrointestinal: No abdominal pain, nausea, vomiting or change in bowel  movements. Genitourinary: No dysuria or urethral discharge. Musculoskeletal:  No joint/muscle pain or swelling. Normal gait and mobility. Neurological: No  seizures, light headedness, memory loss or numbness. Psychiatry: No mood or  behavioral changes. No anxiety or depression.  Endocrine: No hirsutism,  excessive hair loss/alopecia. No polyuria or polydipsia.  Hematologic/Lymphatic: No easy bruising, bleeding or enlarged lymph nodes.  Allergic/Immunologic: No environmental or food allergies.            **Reason for Appointment**    ---       1\. NP for hematospermia    ---    2\. Hematospermia    ---       **Assessments**    ---    1\. Hematospermia - R36.1 (Primary)    ---    2\. Erectile dysfunction, unspecified erectile dysfunction type - N52.9    ---       **Treatment**    ---       **1\. Hematospermia**    _LAB: Urine Dip  POC_ Negative for LE, NIT, and Blood    Notes: Hematospermia is almost always a benign condition. There are multiple  causes for hematospermia, such as urinary tract infection, seminal duct  abnormalities, enlarged prostate, prostatic calculi, GU tumors (very rare),  coagulopathy. In most cases, the evaluation will not identify a clear cause of  hematospermia. Thus, there is no specific medical or surgical treatment for  the majority of patients, and the condition will usually resolve  spontaneously. . There are no obvious reasons at this point for his  hematospermia after evaluation. I reassured him and recommended observational  management for now. If his hematospermia is persistent or he notices any  symptoms, he will call my office. The further evaluation would be transrectal  U/S, pelvic MRI, cystoscopy or even seminal vesiculoscopy. Gilbert Stewart He understood and  will call my office for follow up as needed.    Will check Testosterone.    ---        **2\. Erectile dysfunction, unspecified erectile dysfunction type**    Notes: We discussed the etiology of erectile function, including psychogenic,  vasculogenic, endocrinogenic, neurogenic. The most common reason for ED is age  then follow by diabetes. .    Pt would like to have PSA and T checked to rule out hormonal etiology of his  ED first.    Will check the lab and RTC in one month.      **Preventive Medicine**    ---        Counseling:    Smoking .    BMI Management .    ---      **Follow Up**    ---    Testosterone check and PSA total in AM; F/U in one month       **History of Present Illness**    ---     _Adult Urology_ :    This is a 45 year old male who is here upon self referral for my opinion  regarding hematospermia.    .     _Hematospermia_ :    [name] has a history of hematospermia.    Gilbert Stewart    He noticed blood in his semen since March 2019. It happened once.    .    Color of semen: rusty color    .    Gross hematospermia    .    Prior history of GU surgery or trauma: History of Chronic prostatitis for  10-15 years. Sees a Insurance underwriter in [place] [facility]: Dr Normand Sloop, Montez Hageman. M.D) treated with Ciprofloxacin/Doxacycline with good effect    .    Pain during intercourse or ejaculation: None    .    Fever/chills, dysuria, urgency/frequency or weak urine stream:    .    History of fertility: Never had kids    .    Family history of prostate cancer or other GU cancers: None    .    Patient denies gross hematuria, fever/chills, abdominal/flank pain,  urgency/frequency, weak urine stream, dysuria, appetite changes, new bony  pain, unintentional weight loss or fatigue.    .    .     _ED_ :    Pt also mentioned that his erection is not as strong as before. It can not  maintain. He also feels decreased libido.       **Vital Signs**    ---    Pain scale 3, Ht-in 71, Wt-lbs 244, BMI 34.03, BSA 2.35, Ht-cm 180.34, Wt-kg  110.68.       **Physical Examination**    ---     _Urology PE_ :    General Appearance: Well-developed, well nourished, alert and cooperative,  NAD, normal secondary sexual characteristics.    HEENT: Normo-cephalic, atraumatic, PERRLA, EOMI, anicteric, vision grossly  intact, no nasal discharge. Sinuses, non-tender. Oropharhynx no exudate or  lesions.    Skin: Normal color, texture, turgor, with no lesions, eruptions, rashes,  bruises or petechiae.    Neck: Supple, no masses. Normal ROM. Trachea is  in midline. No thyromegaly.    Chest Normal AP diameter. No rib tenderness.    Lungs: Normal respirations, symmetric excursion with no accessory muscle use.    Back: no CAVT Vertebral column aligned no scoliosis or kyphosis. No masses or  tenderness.    Cardiovascular: RRR. No murmur, pulses are intact.    Abdomen: Soft, benign, non-tender, non-distended, no palpable masses, no  hepato-splenomegaly, no hernias. Bladder is not palpable.    Extremities: No clubbing, cyanosis or pitting edema.    Musculo-Skeletal: No muscle wasting, joint swelling or tenderness.    Lymphatic: No cervical, axillary or inguinal adenopathy.    Neurologic: Oriented 3x with no focal deficits.    _GU Male_ :    Genitalia: Normally developed male genitalia.    Phallus: No lesions or chordee.    Glans: No lesions or inflammation.    Meatus: No discharge.    Scrotum: No mass or tenderness. No hernias, hydroceles or varicoceles, no  Lymphadenopathy.    Spermatic Cords: No masses, induration or tenderness.    Epididymes: No masses, tenderness or induration.    Testes: Normal size and consistency. No masses, asymmetry, tenderness or  atrophy.    Back: No sacral [place] or dimples. No CVAT or SPT. Bladder is not palpable.          **Procedure Codes**    ---       4782 URO MD URINALYSIS AUTO W/O MICROSCOPY    ---    Electronically signed by Augustine Radar , MD on 12/29/2017 at 11:42 PM EDT    Sign off status: Completed        * * *        [facility]    [address]    [place], [place] [address]    Tel: [phone]    Fax: [phone]              * * *          Patient: AURTHER, HARLIN DOB: 1976/03/24 Progress Note: Augustine Radar  12/27/2017    ---    Note generated by eClinicalWorks EMR/PM Software ([email or web address])
